# Patient Record
Sex: Female | Born: 1975 | Hispanic: Yes | Smoking: Never smoker
Health system: Southern US, Community
[De-identification: ages and names within clinical notes are randomized; demographics above are authoritative.]

## PROBLEM LIST (undated history)

## (undated) DIAGNOSIS — R519 Headache, unspecified: Secondary | ICD-10-CM

## (undated) DIAGNOSIS — R51 Headache: Secondary | ICD-10-CM

## (undated) DIAGNOSIS — M797 Fibromyalgia: Secondary | ICD-10-CM

## (undated) DIAGNOSIS — F419 Anxiety disorder, unspecified: Secondary | ICD-10-CM

## (undated) DIAGNOSIS — R002 Palpitations: Secondary | ICD-10-CM

## (undated) DIAGNOSIS — J45909 Unspecified asthma, uncomplicated: Secondary | ICD-10-CM

## (undated) DIAGNOSIS — S0300XA Dislocation of jaw, unspecified side, initial encounter: Secondary | ICD-10-CM

## (undated) HISTORY — DX: Headache, unspecified: R51.9

## (undated) HISTORY — DX: Headache: R51

## (undated) HISTORY — PX: NO PAST SURGERIES: SHX2092

## (undated) HISTORY — DX: Dislocation of jaw, unspecified side, initial encounter: S03.00XA

---

## 2011-05-23 ENCOUNTER — Other Ambulatory Visit (HOSPITAL_COMMUNITY)
Admission: RE | Admit: 2011-05-23 | Discharge: 2011-05-23 | Disposition: A | Discharge status: Home / Self Care | Payer: PRIVATE HEALTH INSURANCE | Source: Ambulatory Visit | Attending: Obstetrics and Gynecology | Admitting: Obstetrics and Gynecology

## 2011-05-23 DIAGNOSIS — Z124 Encounter for screening for malignant neoplasm of cervix: Secondary | ICD-10-CM | POA: Insufficient documentation

## 2011-05-23 DIAGNOSIS — Z113 Encounter for screening for infections with a predominantly sexual mode of transmission: Secondary | ICD-10-CM | POA: Insufficient documentation

## 2011-10-24 ENCOUNTER — Encounter (HOSPITAL_BASED_OUTPATIENT_CLINIC_OR_DEPARTMENT_OTHER): Payer: Self-pay | Admitting: Student

## 2011-10-24 ENCOUNTER — Emergency Department (HOSPITAL_BASED_OUTPATIENT_CLINIC_OR_DEPARTMENT_OTHER)
Admission: EM | Admit: 2011-10-24 | Discharge: 2011-10-24 | Disposition: A | Discharge status: Home / Self Care | Payer: PRIVATE HEALTH INSURANCE | Attending: Emergency Medicine | Admitting: Emergency Medicine

## 2011-10-24 DIAGNOSIS — R0789 Other chest pain: Secondary | ICD-10-CM | POA: Insufficient documentation

## 2011-10-24 DIAGNOSIS — R7309 Other abnormal glucose: Secondary | ICD-10-CM | POA: Insufficient documentation

## 2011-10-24 DIAGNOSIS — R300 Dysuria: Secondary | ICD-10-CM | POA: Insufficient documentation

## 2011-10-24 DIAGNOSIS — R739 Hyperglycemia, unspecified: Secondary | ICD-10-CM

## 2011-10-24 DIAGNOSIS — Z885 Allergy status to narcotic agent status: Secondary | ICD-10-CM | POA: Insufficient documentation

## 2011-10-24 DIAGNOSIS — K5900 Constipation, unspecified: Secondary | ICD-10-CM | POA: Insufficient documentation

## 2011-10-24 DIAGNOSIS — Z882 Allergy status to sulfonamides status: Secondary | ICD-10-CM | POA: Insufficient documentation

## 2011-10-24 DIAGNOSIS — F411 Generalized anxiety disorder: Secondary | ICD-10-CM | POA: Insufficient documentation

## 2011-10-24 HISTORY — DX: Anxiety disorder, unspecified: F41.9

## 2011-10-24 LAB — URINALYSIS, ROUTINE W REFLEX MICROSCOPIC
Nitrite: NEGATIVE
Specific Gravity, Urine: 1.006 (ref 1.005–1.030)
Urobilinogen, UA: 0.2 mg/dL (ref 0.0–1.0)
pH: 7 (ref 5.0–8.0)

## 2011-10-24 LAB — URINE MICROSCOPIC-ADD ON

## 2011-10-24 LAB — TROPONIN I: Troponin I: <0.3 ng/mL (ref <0.30)

## 2011-10-24 LAB — BASIC METABOLIC PANEL
Chloride: 102 mEq/L (ref 96–112)
GFR calc Af Amer: >90 mL/min (ref >90)
GFR calc non Af Amer: >90 mL/min (ref >90)
Potassium: 3.7 mEq/L (ref 3.5–5.1)
Sodium: 138 mEq/L (ref 135–145)

## 2011-10-24 LAB — D-DIMER, QUANTITATIVE: D-Dimer, Quant: 0.27 ug/mL-FEU (ref 0.00–0.48)

## 2011-10-24 MED ORDER — IBUPROFEN 200 MG PO TABS: 200.0000 mg | ORAL_TABLET | Freq: Four times a day (QID) | ORAL | Status: DC | PRN

## 2011-10-24 MED ORDER — DOCUSATE SODIUM 100 MG PO CAPS: 100.0000 mg | ORAL_CAPSULE | Freq: Two times a day (BID) | ORAL | Status: DC

## 2011-10-24 MED ORDER — ACETAMINOPHEN 325 MG PO TABS: 325.0000 mg | ORAL_TABLET | Freq: Four times a day (QID) | ORAL | Status: DC | PRN

## 2011-10-24 MED ORDER — POLYETHYLENE GLYCOL 3350 17 GM/SCOOP PO POWD: 17.0000 g | Freq: Every day | ORAL | Status: DC

## 2011-10-24 MED ORDER — GI COCKTAIL ~~LOC~~
30.0000 mL | Freq: Once | ORAL | Status: AC
  Administered 2011-10-24: 30 mL via ORAL
  Filled 2011-10-24: qty 30

## 2011-10-24 NOTE — ED Notes (Addendum)
Pt in with c/o intermittent left side upper chest pain. Denies radiation, N V D LOC SOB. EKG upon arrival to ED. Reports prior hx of anxiety attacks and take xanax PRN, however reports this doesn't feel like prior anxiety attacks. Reports onset of CP 2 days ago.

## 2011-10-24 NOTE — ED Provider Notes (Signed)
History     CSN: [id number]  Arrival date & time 10/24/11  5784   First MD Initiated Contact with Patient 10/24/11 [phone]      Chief Complaint  Patient presents with  . Chest Pain    (Consider location/radiation/quality/duration/timing/severity/associated sxs/prior treatment) HPI Christine Rosales is a 36 yo woman with PMH significant for heartburn, and anxiety with panic attacks who presents to day with chest pain that started last night. She described being in her usual state of health until last night when she started experiencing chest pain after blow drying her hair and eating some dark chocolate. The pain is localized to her upper left chest, it comes and goes, it is described as sharp, worse when she lies down, and improves when she walks or stands. She took Gaviscon and had relief of her pain for 1 hour but still could not sleep last night 2/2 pain. She never had chest pain like this before.   She has had panic attacks before but this does not feel like one. She takes Xanax once per week which she took 3 days ago.   She has had constipation for the last 3 days with 1 small BM of hard stools early this AM. She also complaints of intermittent dysuria.   Currently she does not take [facility] medications. She is unsure of her LMP as she had breakthrough bleeding this month, which is unusual for her as she has regular menstrual cycles.   There is no family history of CAD or heart disease that she can remember.  Past Medical History  Diagnosis Date  . Anxiety     No past surgical history on file.  No family history on file.  History  Substance Use Topics  . Smoking status: Never Smoker   . Smokeless tobacco: Not on file  . Alcohol Use: No    OB History    Grav Para Term Preterm Abortions TAB SAB Ect Mult Living                  Review of Systems  Constitutional: Negative for fever, chills, diaphoresis, activity change, appetite change and fatigue.  HENT: Negative for neck pain and neck  stiffness.   Respiratory: Negative for cough, chest tightness and shortness of breath.   Cardiovascular: Positive for chest pain. Negative for palpitations and leg swelling.  Gastrointestinal: Positive for abdominal pain and constipation. Negative for nausea, vomiting, diarrhea, blood in stool and abdominal distention.  Genitourinary: Positive for dysuria. Negative for frequency and pelvic pain.  Musculoskeletal: Negative for back pain.  Skin: Negative for rash.  Neurological: Negative for dizziness and headaches.  Psychiatric/Behavioral: Negative for behavioral problems.    Allergies  Codeine and Sulfa antibiotics  Home Medications  No current outpatient prescriptions on file.  BP 148/86  Pulse 108  Temp 98.2 F (36.8 C) (Oral)  Resp 16  Wt 118 lb (53.524 kg)  SpO2 100%  LMP 10/10/2011  Physical Exam  Constitutional: She is oriented to person, place, and time. She appears well-developed and well-nourished. No distress.  HENT:  Head: Atraumatic.  Eyes: Conjunctivae normal are normal. Right eye exhibits no discharge. Left eye exhibits no discharge. No scleral icterus.  Neck: No JVD present.  Cardiovascular: Regular rhythm, normal heart sounds and intact distal pulses.  Exam reveals no gallop and no friction rub.   No murmur heard.      tachycardic  Pulmonary/Chest: Effort normal and breath sounds normal. No respiratory distress. She has no wheezes. She exhibits  tenderness.       Left upper chest point tenderness at upper sternal border, not worse with movement.    Abdominal: Soft. She exhibits no distension and no mass. There is tenderness. There is no rebound and no guarding.       Hyperactive bowel sounds. Tenderness on deep palpation  Musculoskeletal: Normal range of motion. She exhibits no edema and no tenderness.  Neurological: She is alert and oriented to person, place, and time.  Skin: Skin is warm and dry. She is not diaphoretic. No erythema.  Psychiatric: She has a  normal mood and affect. Her behavior is normal.    ED Course  Procedures (including critical care time)  Labs Reviewed  BASIC METABOLIC PANEL - Abnormal; Notable for the following:    Glucose, Bld 109 (*)     All other components within normal limits  URINALYSIS, ROUTINE W REFLEX MICROSCOPIC - Abnormal; Notable for the following:    Hgb urine dipstick TRACE (*)     Leukocytes, UA TRACE (*)     All other components within normal limits  URINE MICROSCOPIC-ADD ON - Abnormal; Notable for the following:    Squamous Epithelial / LPF FEW (*)     Bacteria, UA FEW (*)     All other components within normal limits  D-DIMER, QUANTITATIVE  PREGNANCY, URINE  TROPONIN I  URINE CULTURE   No results found.   1. Localized chest pain   2. Constipation   3. Dysuria   4. Hyperglycemia       MDM  36 yo woman with PMH of anxiety, heartburn, presenting with new onset CP.   Chest pain. Pt has not hx of heart disease but is tachycardic. EKG with tachycardia, no LVH, no ST depression or elevation. To include PE v. ACS v. Costochondritis v. Anxiety v. Referred GI. PE less likely, with no hx of DVT, no recent trauma or surgery, and no estrogen tx. However, per [facility] score, pt tachycardic cannot exclude PE at this time, will check D-Dimer. ACS less likely given unremarkable EKG and not hx of heart disease or family hx of heart disease. Costochondritis possible given point tenderness and hx of blow-drying her hair as a precipitating event. Anxiety could be a possibility although the patient reports different symptoms with anxiety/panic attacks. Referred GI pain also likely given recent constipation.  -D-Dimer negative, PE less likely -Troponin I negative, ACS less likely -BMET normal except for mild hyperglicemia -GI cocktail given, the pain improved but no BM yet  Dysuria. No increased frequency but intermittent dysuria and suprapubic pain.  -UA with few bacteria, few squamous cells, trace leukocytes,  negative for nitrites -Pregnancy test negative -Ordered urine culture  Dispo. Pt to be discharged home, instructed to use Miralax once every day and Colace for stool softener.  Urine culture pending at the time of discharge. Pt has used Augmentin in the past with good results, no reactions.   Patient will follow up with new PCP, possibly Dr. Alwyn Ren for established care, anxiety management, and possible UTI and hyperglycemia work up.

## 2011-10-25 NOTE — ED Provider Notes (Signed)
Medical screening examination/treatment/procedure(s) were performed by non-physician practitioner and as supervising physician I was immediately available for consultation/collaboration.  Medical screening examination/treatment/procedure(s) were performed by non-physician practitioner and as supervising physician I was immediately available for consultation/collaboration.    Nelia Shi, MD 10/25/11 [phone]

## 2011-10-26 LAB — URINE CULTURE: Colony Count: >=100000

## 2011-10-28 NOTE — ED Notes (Signed)
+   Urine Chart sent to EDP office for review. 

## 2011-11-03 NOTE — ED Notes (Signed)
rx for Macrobid 100 mg SIG : 1 tab po BID x 7 take with food DISP #14 refill none per PA-C

## 2011-11-04 ENCOUNTER — Telehealth (HOSPITAL_COMMUNITY): Payer: Self-pay | Admitting: *Deleted

## 2011-11-07 NOTE — ED Notes (Signed)
Pt husband in lobby requesting to pick up prescription for pt. Pt advised to check with pharmacy due to last visit to this dept was on 09/23

## 2012-04-15 ENCOUNTER — Emergency Department (HOSPITAL_BASED_OUTPATIENT_CLINIC_OR_DEPARTMENT_OTHER)
Admission: EM | Admit: 2012-04-15 | Discharge: 2012-04-15 | Disposition: A | Discharge status: Home / Self Care | Payer: PRIVATE HEALTH INSURANCE | Attending: Emergency Medicine | Admitting: Emergency Medicine

## 2012-04-15 ENCOUNTER — Encounter (HOSPITAL_BASED_OUTPATIENT_CLINIC_OR_DEPARTMENT_OTHER): Payer: Self-pay

## 2012-04-15 DIAGNOSIS — R110 Nausea: Secondary | ICD-10-CM | POA: Insufficient documentation

## 2012-04-15 DIAGNOSIS — T43205A Adverse effect of unspecified antidepressants, initial encounter: Secondary | ICD-10-CM | POA: Insufficient documentation

## 2012-04-15 DIAGNOSIS — Z79899 Other long term (current) drug therapy: Secondary | ICD-10-CM | POA: Insufficient documentation

## 2012-04-15 DIAGNOSIS — IMO0001 Reserved for inherently not codable concepts without codable children: Secondary | ICD-10-CM | POA: Insufficient documentation

## 2012-04-15 DIAGNOSIS — I10 Essential (primary) hypertension: Secondary | ICD-10-CM | POA: Insufficient documentation

## 2012-04-15 DIAGNOSIS — R42 Dizziness and giddiness: Secondary | ICD-10-CM | POA: Insufficient documentation

## 2012-04-15 DIAGNOSIS — F411 Generalized anxiety disorder: Secondary | ICD-10-CM | POA: Insufficient documentation

## 2012-04-15 HISTORY — DX: Fibromyalgia: M79.7

## 2012-04-15 NOTE — ED Notes (Signed)
Pt states that she has started taking medication for depression, blood pressure, and antibiotics for UTI.  Pt states that she feels light headed, dizziness, and seems anxious at triage.

## 2012-04-15 NOTE — ED Provider Notes (Signed)
History     CSN: [id number]  Arrival date & time 04/15/12  1246   First MD Initiated Contact with Patient 04/15/12 1258      Chief Complaint  Patient presents with  . Dizziness  . Hypertension    (Consider location/radiation/quality/duration/timing/severity/associated sxs/prior treatment) Patient is a 37 y.o. female presenting with hypertension.  Hypertension   Pt with history of anxiety and fibromyalgia was recently started on Cipro for UTI. She was also given Cymbalta for her fibromylagia and anxiety and atenolol for palpitations. She states she took Cymbalta for first time this AM and a short time later began to feel anxious, nauseated, hot flashes and lightheaded. She took her BP and it was elevated (140/100) so she took an atenolol a few hours later.   Past Medical History  Diagnosis Date  . Anxiety   . Fibromyalgia     History reviewed. No pertinent past surgical history.  History reviewed. No pertinent family history.  History  Substance Use Topics  . Smoking status: Never Smoker   . Smokeless tobacco: Not on file  . Alcohol Use: No    OB History   Grav Para Term Preterm Abortions TAB SAB Ect Mult Living                  Review of Systems All other systems reviewed and are negative except as noted in HPI.   Allergies  Codeine and Sulfa antibiotics  Home Medications   Current Outpatient Rx  Name  Route  Sig  Dispense  Refill  . atenolol (TENORMIN) 25 MG tablet   Oral   Take 25 mg by mouth daily.         . ciprofloxacin (CIPRO) 500 MG tablet   Oral   Take 500 mg by mouth 2 (two) times daily.         . DULoxetine (CYMBALTA) 30 MG capsule   Oral   Take 30 mg by mouth daily.         Marland Kitchen acetaminophen (TYLENOL) 325 MG tablet   Oral   Take 1 tablet (325 mg total) by mouth every 6 (six) hours as needed for pain.   60 tablet   0   . docusate sodium (COLACE) 100 MG capsule   Oral   Take 1 capsule (100 mg total) by mouth 2 (two) times  daily.   10 capsule   0   . ibuprofen (ADVIL) 200 MG tablet   Oral   Take 1 tablet (200 mg total) by mouth every 6 (six) hours as needed for pain.   30 tablet   0   . polyethylene glycol powder (MIRALAX) powder   Oral   Take 17 g by mouth daily.   255 g   0     BP 127/90  Pulse 79  Temp(Src) 98.1 F (36.7 C) (Oral)  Resp 24  Ht 5\' 4"  (1.626 m)  Wt 118 lb (53.524 kg)  BMI 20.24 kg/m2  SpO2 100%  LMP 04/15/2012  Physical Exam  Nursing note and vitals reviewed. Constitutional: She is oriented to person, place, and time. She appears well-developed and well-nourished.  HENT:  Head: Normocephalic and atraumatic.  Eyes: EOM are normal. Pupils are equal, round, and reactive to light.  Neck: Normal range of motion. Neck supple.  Cardiovascular: Normal rate, normal heart sounds and intact distal pulses.   Pulmonary/Chest: Effort normal and breath sounds normal.  Abdominal: Bowel sounds are normal. She exhibits no distension. There is no tenderness.  Musculoskeletal: Normal range of motion. She exhibits no edema and no tenderness.  Neurological: She is alert and oriented to person, place, and time. She has normal strength. No cranial nerve deficit or sensory deficit.  Skin: Skin is warm and dry. No rash noted.  Psychiatric: She has a normal mood and affect.    ED Course  Procedures (including critical care time)  Labs Reviewed - No data to display No results found.   No diagnosis found.    MDM  Vitals here unremarkable. She is likely having adverse reaction to Cymbalta. Pt and family at bedside state she has had similar reactions to other medications in the past. Advised to continue atenolol, stop Cymbalta and follow up with PCP. No allergic reaction. No indication for ED lab or imaging studies.         Charles B. Bernette Mayers, MD 04/15/12 1316

## 2013-10-25 ENCOUNTER — Emergency Department (HOSPITAL_BASED_OUTPATIENT_CLINIC_OR_DEPARTMENT_OTHER)
Admission: EM | Admit: 2013-10-25 | Discharge: 2013-10-25 | Disposition: A | Discharge status: Home / Self Care | Payer: PRIVATE HEALTH INSURANCE | Attending: Emergency Medicine | Admitting: Emergency Medicine

## 2013-10-25 ENCOUNTER — Encounter (HOSPITAL_BASED_OUTPATIENT_CLINIC_OR_DEPARTMENT_OTHER): Payer: Self-pay | Admitting: Emergency Medicine

## 2013-10-25 DIAGNOSIS — IMO0002 Reserved for concepts with insufficient information to code with codable children: Secondary | ICD-10-CM | POA: Insufficient documentation

## 2013-10-25 DIAGNOSIS — R6884 Jaw pain: Secondary | ICD-10-CM | POA: Diagnosis present

## 2013-10-25 DIAGNOSIS — Z8659 Personal history of other mental and behavioral disorders: Secondary | ICD-10-CM | POA: Diagnosis not present

## 2013-10-25 DIAGNOSIS — M2669 Other specified disorders of temporomandibular joint: Secondary | ICD-10-CM | POA: Insufficient documentation

## 2013-10-25 DIAGNOSIS — Z79899 Other long term (current) drug therapy: Secondary | ICD-10-CM | POA: Insufficient documentation

## 2013-10-25 HISTORY — DX: Palpitations: R00.2

## 2013-10-25 MED ORDER — PREDNISONE 10 MG PO TABS: ORAL_TABLET | ORAL | Status: DC

## 2013-10-25 MED ORDER — HYDROCODONE-ACETAMINOPHEN 5-325 MG PO TABS: 1.0000 | ORAL_TABLET | ORAL | Status: DC | PRN

## 2013-10-25 NOTE — ED Notes (Signed)
NP at bedside.

## 2013-10-25 NOTE — ED Notes (Signed)
Pt reports taking ibuprofen. Also sts taking mobic with minimal relief.

## 2013-10-25 NOTE — Discharge Instructions (Signed)
Dislocacin o subluxacin (Dislocation or Subluxation) La dislocacin de una articulacin se produce cuando los extremos de dos o ms huesos adyacentes dejan de Careers adviser. Una subluxacin es una forma menor de dislocacin, en la que dos o ms huesos adyacentes no estn correctamente alineados. Las articulaciones ms susceptibles de dislocacin son los hombros, las rtulas y los dedos.  SNTOMAS  Dolor instantneo en el momento de la lesin.  Deformidad notoria en la zona de la articulacin.  Amplitud de movimientos limitada. CAUSAS  Generalmente es un traumatismo que distiende o rompe los ligamentos que rodean la articulacin y que [employer] huesos juntos.  En algunos casos es un defecto que est presente al nacer (congnito) debido a que las superficies de las articulaciones no son profundas o Location manager.  Enfermedad de las articulaciones, como en el caso de la artritis u otras enfermedades de los ligamentos y los tejidos alrededor de Water engineer. LOS RIESGOS AUMENTAN CON:  Traumatismos repetidos en la articulacin.  Dislocacin previa de una articulacin.  Deportes de Diplomatic Services operational officer (ftbol, rugby, hockey o lacrosse) o deportes que requieran movimientos repetitivos del brazo por encima de la cabeza (lanzamiento, natacin, vley)  Artritis reumatoidea  Afecciones congnitas de las articulaciones. PREVENCIN  Architectural technologist y elongue adecuadamente antes de [place].  Mantenga un estado fsico adecuado.  La flexibilidad de las articulaciones  La fuerza y la resistencia muscular.  El buen [place] cardiovascular.  Utilizar un equipo protector y asegurarse de Firefighter.  Aprenda y [employer]. PRONSTICO Este trastorno generalmente es curable con el tratamiento adecuado. Luego que las articulaciones que sufrieron la dislocacin se Naval architect (reducido), podr necesitar cierto tiempo de inmovilizacin  con un yeso, tabilla o cabestrillo durante 2 a 6 semanas, a las que seguir un perodo de ejercicios de elongacin y fortalecimiento que podr Optometrist en su casa o con un terapeuta. COMPLICACIONES RELACIONADAS  Lesin en los nervios o en los principales vasos sanguneos de la zona, lo que causa adormecimiento, enfriamiento o palidez.  Lesiones recurrentes en la articulacin.  Artritis en la articulacin afectada.  Fractura de la articulacin TRATAMIENTO El tratamiento inicialmente implica la alineacin de los huesos (reduccin) de Water engineer. La reduccin slo puede realizarla una persona entrenada en el procedimiento. Luego de la reduccin de Water engineer, le indicarn medicamentos y la aplicacin de hielo para Best boy y la inflamacin. Se inmovilizar la articulacin para permitir que los msculos y ligamentos se curen. Si una articulacin sufre dislocaciones recurrentes, ser necesario que se someta a una ciruga para tensar o Training and development officer las Editor, commissioning. Luego de la ciruga, deber Optometrist ejercicios de elongacin y fortalecimiento. Los ejercicios pueden Press photographer o con un terapeuta. MEDICAMENTOS  Algunos pacientes necesitarn sedantes o relajantes musculares para realizar la reduccin de la articulacin  Si necesita analgsicos, se recomiendan los antiinflamatorios no esteroides, como aspirina e ibuprofeno y otros calmantes menores, como acetaminofeno  No los tome dentro de los 7 das previos a la [place].  Podrn ser necesarios los analgsicos prescriptos. Utilcelos como se le indique y slo cuando lo necesite. SOLICITE ATENCIN MDICA SI:   Los sntomas empeoran o no mejoran a Designer, television/film set.  Tiene dificultad para mover una articulacin luego del traumatismo.  [place] extremidades se adormece se torna plida o fra luego de un traumatismo. Esto es Engineer, maintenance (IT).  Las dislocaciones o subluxaciones pueden ocurrir  repetidamente. Document Released: 11/03/2005 Document Revised: 04/11/2011 ExitCare Patient  Information 2015 [place], [place]. This information is not intended to replace advice given to you by your health care provider. Make sure you discuss any questions you have with your health care provider.

## 2013-10-25 NOTE — ED Provider Notes (Signed)
CSN: [id number]     Arrival date & time 10/25/13  1344 History   First MD Initiated Contact with Patient 10/25/13 1424     Chief Complaint  Patient presents with  . Jaw Pain     (Consider location/radiation/quality/duration/timing/severity/associated sxs/prior Treatment) HPI Comments: Pt states that she had chronic left jaw pain and she was told at one point that she has tmj. She states that yesterday she bit into something and now she is having pain with opening her mouth. She states that she has been taking ibuprofen without much relief. Denies swelling to the area  The history is provided by the patient. No language interpreter was used.    Past Medical History  Diagnosis Date  . Anxiety   . Fibromyalgia   . Palpitations    History reviewed. No pertinent past surgical history. No family history on file. History  Substance Use Topics  . Smoking status: Never Smoker   . Smokeless tobacco: Not on file  . Alcohol Use: No   OB History   Grav Para Term Preterm Abortions TAB SAB Ect Mult Living                 Review of Systems  Constitutional: Negative.   Respiratory: Negative.   Cardiovascular: Negative.       Allergies  Codeine and Sulfa antibiotics  Home Medications   Prior to Admission medications   Medication Sig Start Date End Date Taking? Authorizing Provider  atenolol (TENORMIN) 25 MG tablet Take 25 mg by mouth daily.   Yes Historical Provider, MD  ciprofloxacin (CIPRO) 500 MG tablet Take 500 mg by mouth 2 (two) times daily.    Historical Provider, MD  HYDROcodone-acetaminophen (NORCO/VICODIN) 5-325 MG per tablet Take 1-2 tablets by mouth every 4 (four) hours as needed. 10/25/13   Glendell Docker, NP  predniSONE (DELTASONE) 10 MG tablet 6 day step down dose 10/25/13   Glendell Docker, NP   BP 122/93  Pulse 81  Temp(Src) 98.2 F (36.8 C) (Oral)  Resp 18  Ht 5' 3.5" (1.613 m)  Wt 120 lb (54.432 kg)  BMI 20.92 kg/m2  SpO2 100%  LMP 10/25/2013 Physical  Exam  Nursing note and vitals reviewed. Constitutional: She appears well-developed and well-nourished.  HENT:  Head: Normocephalic and atraumatic.  Right Ear: External ear normal.  Left Ear: External ear normal.  tmj click bilaterally. Opening mouth without any problem. Pain with palpation  Eyes: EOM are normal.  Cardiovascular: Normal rate and regular rhythm.   Pulmonary/Chest: Effort normal and breath sounds normal.  Musculoskeletal: Normal range of motion.    ED Course  Procedures (including critical care time) Labs Review Labs Reviewed - No data to display  Imaging Review No results found.   EKG Interpretation None      MDM   Final diagnoses:  TMJ inflammation    No sign of dislocation or subluxation. Will treat with prednisone and pain medication and gave referral to dentist for continued symptoms    Glendell Docker, NP 10/25/13 1452

## 2013-10-25 NOTE — ED Provider Notes (Signed)
Medical screening examination/treatment/procedure(s) were performed by non-physician practitioner and as supervising physician I was immediately available for consultation/collaboration.   Houston Siren III, MD 10/25/13 [phone]

## 2013-10-25 NOTE — ED Notes (Addendum)
Patient states she was eating yesterday and felt a pop in her left jaw.  Now has limited range of motion in her jaw with pain, with pain radiating into left neck, shoulder and arm.  History of TMJ.

## 2014-06-25 ENCOUNTER — Emergency Department (HOSPITAL_BASED_OUTPATIENT_CLINIC_OR_DEPARTMENT_OTHER)
Admission: EM | Admit: 2014-06-25 | Discharge: 2014-06-25 | Disposition: A | Discharge status: Home / Self Care | Payer: PRIVATE HEALTH INSURANCE | Attending: Emergency Medicine | Admitting: Emergency Medicine

## 2014-06-25 ENCOUNTER — Encounter (HOSPITAL_BASED_OUTPATIENT_CLINIC_OR_DEPARTMENT_OTHER): Payer: Self-pay | Admitting: *Deleted

## 2014-06-25 ENCOUNTER — Emergency Department (HOSPITAL_BASED_OUTPATIENT_CLINIC_OR_DEPARTMENT_OTHER): Payer: PRIVATE HEALTH INSURANCE

## 2014-06-25 DIAGNOSIS — S199XXA Unspecified injury of neck, initial encounter: Secondary | ICD-10-CM | POA: Diagnosis present

## 2014-06-25 DIAGNOSIS — Y9301 Activity, walking, marching and hiking: Secondary | ICD-10-CM | POA: Insufficient documentation

## 2014-06-25 DIAGNOSIS — M797 Fibromyalgia: Secondary | ICD-10-CM | POA: Diagnosis not present

## 2014-06-25 DIAGNOSIS — S239XXA Sprain of unspecified parts of thorax, initial encounter: Secondary | ICD-10-CM | POA: Diagnosis not present

## 2014-06-25 DIAGNOSIS — Y9289 Other specified places as the place of occurrence of the external cause: Secondary | ICD-10-CM | POA: Insufficient documentation

## 2014-06-25 DIAGNOSIS — Y998 Other external cause status: Secondary | ICD-10-CM | POA: Insufficient documentation

## 2014-06-25 DIAGNOSIS — S161XXA Strain of muscle, fascia and tendon at neck level, initial encounter: Secondary | ICD-10-CM | POA: Insufficient documentation

## 2014-06-25 DIAGNOSIS — Z79899 Other long term (current) drug therapy: Secondary | ICD-10-CM | POA: Diagnosis not present

## 2014-06-25 DIAGNOSIS — F419 Anxiety disorder, unspecified: Secondary | ICD-10-CM | POA: Insufficient documentation

## 2014-06-25 DIAGNOSIS — R52 Pain, unspecified: Secondary | ICD-10-CM

## 2014-06-25 DIAGNOSIS — W108XXA Fall (on) (from) other stairs and steps, initial encounter: Secondary | ICD-10-CM | POA: Insufficient documentation

## 2014-06-25 DIAGNOSIS — S29012A Strain of muscle and tendon of back wall of thorax, initial encounter: Secondary | ICD-10-CM | POA: Insufficient documentation

## 2014-06-25 DIAGNOSIS — S233XXA Sprain of ligaments of thoracic spine, initial encounter: Secondary | ICD-10-CM

## 2014-06-25 MED ORDER — KETOROLAC TROMETHAMINE 30 MG/ML IJ SOLN
30.0000 mg | Freq: Once | INTRAMUSCULAR | Status: AC
  Administered 2014-06-25: 30 mg via INTRAMUSCULAR
  Filled 2014-06-25: qty 1

## 2014-06-25 NOTE — ED Notes (Addendum)
Pt sts her PCP put her on Lyrica 2 weeks ago for fibromyalgia. She sts the med makes her dizzy and last night when walking down stairs, she slipped and fell landing on her buttocks. She sts that since the fall, she has had increased pain and now has upper, middle back pain. Pt also c/o swelling in her face and tachycardia.

## 2014-06-25 NOTE — ED Provider Notes (Signed)
CSN: [id number]     Arrival date & time 06/25/14  1559 History   First MD Initiated Contact with Patient 06/25/14 1610     Chief Complaint  Patient presents with  . Fall    Patient is a 39 y.o. female presenting with fall. The history is provided by the patient.  Fall This is a new problem. The current episode started yesterday. Pertinent negatives include no chest pain, no abdominal pain and no headaches. The symptoms are aggravated by walking. The symptoms are relieved by rest.  pt reports she fell on step yesterday landing mostly on her buttocks but she may have hit her neck and back in fall.  No head injury.  No LOC.  No new HA  She reports recently starting lyrica for fibromyalgia and this has made her dizzy recently. No LOC reported  Past Medical History  Diagnosis Date  . Anxiety   . Fibromyalgia   . Palpitations    History reviewed. No pertinent past surgical history. No family history on file. History  Substance Use Topics  . Smoking status: Never Smoker   . Smokeless tobacco: Not on file  . Alcohol Use: No   OB History    No data available     Review of Systems  Constitutional: Negative for fever.  Cardiovascular: Negative for chest pain.  Gastrointestinal: Negative for abdominal pain.  Musculoskeletal: Positive for back pain and neck pain.  Neurological: Negative for headaches.  All other systems reviewed and are negative.     Allergies  Codeine; Sulfa antibiotics; and Sudafed  Home Medications   Prior to Admission medications   Medication Sig Start Date End Date Taking? Authorizing Provider  ALPRAZolam Duanne Moron) 0.25 MG tablet Take 0.25 mg by mouth at bedtime as needed for anxiety.   Yes Historical Provider, MD  atenolol (TENORMIN) 25 MG tablet Take 25 mg by mouth daily.   Yes Historical Provider, MD  pregabalin (LYRICA) 75 MG capsule Take 75 mg by mouth 3 (three) times daily.   Yes Historical Provider, MD   BP 127/90 mmHg  Pulse 81  Temp(Src) 98.4  F (36.9 C) (Oral)  Resp 16  Ht 5\' 3"  (1.6 m)  Wt 124 lb (56.246 kg)  BMI 21.97 kg/m2  SpO2 100%  LMP 06/01/2014 Physical Exam CONSTITUTIONAL: Well developed/well nourished HEAD: Normocephalic/atraumatic EYES: EOMI/PERRL ENMT: Mucous membranes moist, no signs of trauma, no facial edema noted NECK: supple no meningeal signs SPINE/BACK:mild cervical/thoracic tenderness, no lumbar tenderness, No bruising/crepitance/stepoffs noted to spine CV: S1/S2 noted, no murmurs/rubs/gallops noted LUNGS: Lungs are clear to auscultation bilaterally, no apparent distress ABDOMEN: soft, nontender, no rebound or guarding, bowel sounds noted throughout abdomen GU:no cva tenderness NEURO: Pt is awake/alert/appropriate, moves all extremitiesx4.  No facial droop.  Gait without ataxia.  No arm/leg drift.  No past pointing EXTREMITIES: pulses normal/equal, full ROM, no obvious deformities noted SKIN: warm, color normal PSYCH: no abnormalities of mood noted, alert and oriented to situation  ED Course  Procedures   Medications  ketorolac (TORADOL) 30 MG/ML injection 30 mg (30 mg Intramuscular Given 06/25/14 1635)    Imaging Review Dg Cervical Spine Complete  06/25/2014   CLINICAL DATA:  Pain following fall down stairs  EXAM: CERVICAL SPINE  4+ VIEWS  COMPARISON:  None.  FINDINGS: Frontal, lateral, open-mouth odontoid, and bilateral oblique views were obtained. There is no fracture or spondylolisthesis. Prevertebral soft tissues and predental space regions are normal. Disc spaces appear intact. There is no appreciable facet arthropathy. There  is reversal of lordotic curvature.  IMPRESSION: There is reversal of lordotic curvature. This finding most likely is indicative of muscle spasm. If, however, there is concern clinically for ligamentous injury, lateral flexion-extension views could be helpful to further evaluate. No fracture or spondylolisthesis. No appreciable arthropathy.   Electronically Signed   By:  Lowella Grip III M.D.   On: 06/25/2014 17:11   Dg Thoracic Spine 4v  06/25/2014   CLINICAL DATA:  Pain following fall down stairs  EXAM: THORACIC SPINE - 3 VIEW  COMPARISON:  None.  FINDINGS: Frontal, lateral, and swimmer's views were obtained. There is no fracture or spondylolisthesis. Disc spaces appear intact. No erosive change.  IMPRESSION: No fracture or spondylolisthesis.  No appreciable arthropathy.   Electronically Signed   By: Lowella Grip III M.D.   On: 06/25/2014 17:13    Pt well appearing, no distress, ambulatory, doubt occult cspine/thoracic injury (no fx on XRAY) Will need f/u with PCP given med reaction to lyrica, she wants to stop this medication   MDM   Final diagnoses:  Pain  Cervical strain, acute, initial encounter  Thoracic sprain and strain, initial encounter    Nursing notes including past medical history and social history reviewed and considered in documentation ,xrays/imaging reviewed by myself and considered during evaluation     Ripley Fraise, MD 06/25/14 1745

## 2014-06-25 NOTE — Discharge Instructions (Signed)
You have neck pain, possibly from a cervical strain and/or pinched nerve.   SEEK IMMEDIATE MEDICAL ATTENTION IF: You develop difficulties swallowing or breathing.  You have new or worse numbness, weakness, tingling, or movement problems in your arms or legs.  You develop increasing pain which is uncontrolled with medications.  You have change in bowel or bladder function, or other concerns.  SEEK IMMEDIATE MEDICAL ATTENTION IF: New numbness, tingling, weakness, or problem with the use of your arms or legs.  Severe back pain not relieved with medications.  Change in bowel or bladder control (if you lose control of stool or urine, or if you are unable to urinate) Increasing pain in any areas of the body (such as chest or abdominal pain).  Shortness of breath, dizziness or fainting.  Nausea (feeling sick to your stomach), vomiting, fever, or sweats.

## 2014-10-21 ENCOUNTER — Emergency Department (HOSPITAL_BASED_OUTPATIENT_CLINIC_OR_DEPARTMENT_OTHER)
Admission: EM | Admit: 2014-10-21 | Discharge: 2014-10-21 | Disposition: A | Discharge status: Home / Self Care | Payer: PRIVATE HEALTH INSURANCE | Attending: Emergency Medicine | Admitting: Emergency Medicine

## 2014-10-21 ENCOUNTER — Encounter (HOSPITAL_BASED_OUTPATIENT_CLINIC_OR_DEPARTMENT_OTHER): Payer: Self-pay | Admitting: *Deleted

## 2014-10-21 DIAGNOSIS — M542 Cervicalgia: Secondary | ICD-10-CM | POA: Diagnosis not present

## 2014-10-21 DIAGNOSIS — M549 Dorsalgia, unspecified: Secondary | ICD-10-CM | POA: Diagnosis not present

## 2014-10-21 DIAGNOSIS — R112 Nausea with vomiting, unspecified: Secondary | ICD-10-CM | POA: Insufficient documentation

## 2014-10-21 DIAGNOSIS — Z79899 Other long term (current) drug therapy: Secondary | ICD-10-CM | POA: Insufficient documentation

## 2014-10-21 DIAGNOSIS — F419 Anxiety disorder, unspecified: Secondary | ICD-10-CM | POA: Diagnosis not present

## 2014-10-21 DIAGNOSIS — R21 Rash and other nonspecific skin eruption: Secondary | ICD-10-CM | POA: Diagnosis present

## 2014-10-21 DIAGNOSIS — R000 Tachycardia, unspecified: Secondary | ICD-10-CM | POA: Diagnosis not present

## 2014-10-21 DIAGNOSIS — B019 Varicella without complication: Secondary | ICD-10-CM | POA: Diagnosis not present

## 2014-10-21 MED ORDER — VALACYCLOVIR HCL 1 G PO TABS: 1000.0000 mg | ORAL_TABLET | Freq: Three times a day (TID) | ORAL | Status: DC

## 2014-10-21 MED ORDER — ONDANSETRON 8 MG PO TBDP
8.0000 mg | ORAL_TABLET | Freq: Three times a day (TID) | ORAL | Status: DC | PRN
Start: 2014-10-21 — End: 2015-02-27

## 2014-10-21 NOTE — ED Notes (Signed)
Pt placed on droplet precautions

## 2014-10-21 NOTE — Discharge Instructions (Signed)
Varicela (Chickenpox) La varicela es una enfermedad causada por un virus. La varicela puede ser muy grave en los adultos, quienes tienen mayor riesgo de [employer] nios. [place], se incluyen:  Neumona.  Infeccin en la piel.  Infeccin en los huesos (osteomielitis).  Infeccin en las articulaciones (artritis sptica).  Infeccin en el cerebro (encefalitis).  Sndrome de shock txico.  Problemas de sangrado.  Problemas de equilibrio y control muscular (ataxia cerebelosa).  Nacimiento de un beb con defectos congnitos si est embarazada.  Muerte. Si usted ya ha tenido varicela antes, es probable que no la tenga de [place]. Si est expuesto a la varicela y nunca ha tenido la enfermedad ni se ha colocado la vacuna, puede enfermarse en el transcurso de 21das. CAUSAS  La varicela es causada por un virus. El virus se propaga fcilmente de Ardelia Mems persona a la otra a travs de las diminutas gotas que quedan en el aire cuando una persona infectada tose o estornuda. Tambin se propaga a travs del lquido que produce la erupcin cutnea por varicela. FACTORES DE RIESGO Estn en riesgo las personas que nunca han tenido varicela y que nunca se han vacunado. Usted tambin puede correr ms riesgo de varicela si:  Es trabajador de [employer]	[place].  Es estudiante universitario.  Trabaja en las [place].  Vive en una institucin.  Es Health and safety inspector.  Tiene poca resistencia a las infecciones. Barada En esta erupcin, aparecen ampollas que pican y al cicatrizar forman Serita Grammes. La erupcin cutnea [place] despus de otros sntomas.  Dolores y [employer].  Dolor de [place].  Irritabilidad.  [place]ans[place]io.  Cristy Hilts.  Dolor de Investment banker, operational. DIAGNSTICO  El mdico har el diagnstico en funcin de sus sntomas. Pueden hacerle un anlisis de sangre para confirmar el diagnstico. TRATAMIENTO  Los  tratamientos pueden incluir:  Tomar un medicamento para acortar la enfermedad y reducir su gravedad.  Aplicar una locin de calamina para Barrister's clerk.  Usar bicarbonato de sodio o baos de avena para calmar la picazn en la piel. Pregntele al mdico si puede usar un medicamento de venta libre llamado antihistamnico para reducir Cabin crew. [place] los medicamentos solamente como se lo haya indicado el mdico.  Intente tomar un bao de inmersin en agua templada con frecuencia. Agregue varias cucharadas de bicarbonato de sodio o avena en el agua para que el bao tenga un efecto ms calmante. No se bae con agua caliente.  Aplique una bolsa de hielo o un pao fro [employer] erupcin cutnea para Barrister's clerk.  Lvese las manos con frecuencia. Esto reduce la probabilidad de contraer una infeccin bacteriana en la piel y de transmitir el virus a Producer, television/film/video.  No coma ni beba nada condimentado, salado o cido si tiene ampollas en la boca. Las bebidas y los alimentos blandos, suaves y fros sern ms fciles de Chartered loss adjuster.  No est cerca de:  Personas que no han tenido varicela y que no se han vacunado contra esta enfermedad.  Personas que tengan el sistema inmunitario dbil, por ejemplo, quienes tienen VIH, sida o cncer; quienes han tenido un trasplante o reciben quimioterapia; y quienes toman medicamentos que debilitan el sistema inmunitario.  Embarazadas.  Si usted tiene varicela y est en contacto con una persona que no tenido antes esta enfermedad o no se ha vacunado contra ella, que tiene el sistema inmunitario dbil  o que est embarazada, esa persona debe llamar a un mdico. SOLICITE ATENCIN MDICA DE INMEDIATO SI:   Tiene dificultad para respirar.  Sufre un dolor intenso de [place].  Presenta rigidez en el cuello.  Presenta dolor intenso o rigidez en las articulaciones.  Se siente desorientado o confundido.  Tiene  dificultad para caminar o mantener el equilibrio.  Tiene fiebre y los sntomas empeoran repentinamente.  El rea alrededor de una ampolla supura pus o est muy roja, caliente al tacto o dolorida. ASEGRESE DE QUE:  Comprende estas instrucciones.  Controlar su afeccin.  Recibir ayuda de inmediato si no mejora o si empeora. Document Released: 04/15/2008 Document Revised: 06/03/2013 [facility] Patient Information 2015 [place]. This information is not intended to replace advice given to you by your health care provider. Make sure you discuss any questions you have with your health care provider.

## 2014-10-21 NOTE — ED Provider Notes (Signed)
CSN: [id number]     Arrival date & time 10/21/14  0825 History   First MD Initiated Contact with Patient 10/21/14 [phone]     No chief complaint on file.    (Consider location/radiation/quality/duration/timing/severity/associated sxs/prior Treatment) The history is provided by the patient.   patient presents with fevers rash headaches and some nausea vomiting. Began Lexapro on Wednesday and states on Friday she began to feel bad. Had some nausea and vomiting initially. Then developed a rash. States it is blistering of the rash. Began on her thigh and is now diffusely over her body. Has headache and low back pain. No confusion. Reported fevers up to 100.5 at home. Patient states her mother has recently been diagnosed with chickenpox. Patient has not had immunizations because she is from [place]. No previous infection with chickenpox. No confusion.  Past Medical History  Diagnosis Date  . Anxiety   . Fibromyalgia   . Palpitations    History reviewed. No pertinent past surgical history. No family history on file. Social History  Substance Use Topics  . Smoking status: Never Smoker   . Smokeless tobacco: None  . Alcohol Use: No   OB History    No data available     Review of Systems  Constitutional: Positive for fever. Negative for appetite change.  Respiratory: Negative for cough.   Cardiovascular: Negative for chest pain.  Gastrointestinal: Positive for nausea and vomiting.  Musculoskeletal: Positive for back pain and neck pain.  Skin: Positive for rash.  Neurological: Positive for headaches.      Allergies  Codeine; Sulfa antibiotics; and Sudafed  Home Medications   Prior to Admission medications   Medication Sig Start Date End Date Taking? Authorizing Provider  ALPRAZolam Duanne Moron) 0.25 MG tablet Take 0.25 mg by mouth at bedtime as needed for anxiety.   Yes Historical Provider, MD  atenolol (TENORMIN) 25 MG tablet Take 25 mg by mouth daily.   Yes Historical Provider,  MD  escitalopram (LEXAPRO) 20 MG tablet Take 10 mg by mouth daily.   Yes Historical Provider, MD  ondansetron (ZOFRAN-ODT) 8 MG disintegrating tablet Take 1 tablet (8 mg total) by mouth every 8 (eight) hours as needed for nausea or vomiting. 10/21/14   Davonna Belling, MD  valACYclovir (VALTREX) 1000 MG tablet Take 1 tablet (1,000 mg total) by mouth 3 (three) times daily. 10/21/14   Davonna Belling, MD   BP 120/67 mmHg  Pulse 101  Temp(Src) 98.2 F (36.8 C) (Oral)  Resp 18  Ht 5\' 4"  (1.626 m)  Wt 125 lb (56.7 kg)  BMI 21.45 kg/m2  SpO2 100%  LMP 10/06/2014 Physical Exam  Constitutional: She appears well-developed.  HENT:  Head: Normocephalic.  Eyes: Pupils are equal, round, and reactive to light.  Neck:  No meningismus.  Cardiovascular:  Mild tachycardia  Pulmonary/Chest: Effort normal.  Abdominal: Soft.  Musculoskeletal: Normal range of motion.  Neurological: She is alert.  Skin: Rash noted.  Vesicular rash somewhat diffusely. There is erythema with a central vesicle.      ED Course  Procedures (including critical care time) Labs Review Labs Reviewed - No data to display  Imaging Review No results found. I have personally reviewed and evaluated these images and lab results as part of my medical decision-making.   EKG Interpretation None      MDM   Final diagnoses:  Varicella    Patient with apparent parasellar. Has exposure in her mother. Has not been vaccinated. Discussed with Dr. De Burrs from infectious  disease. Doubt encephalitis this time. Doubt severe disease.. Patient is well appearing. Will start on valacyclovir. Patient does not work with small children or immunosuppressed people. She does not have contact with these. Will discharge home.    Davonna Belling, MD 10/21/14 [phone]

## 2014-10-21 NOTE — ED Notes (Addendum)
Started on lexapro on Wednesday and nausea and cold sweats on Friday with fever and h/a. Now has rash on back and abd. Rash is blistered areas and scabbed areas all over body. Tender area behind bil ears.

## 2015-02-26 ENCOUNTER — Telehealth: Payer: Self-pay | Admitting: *Deleted

## 2015-02-26 NOTE — Telephone Encounter (Signed)
Unable to complete pre-visit call as patient is primarily Spanish speaking per review.

## 2015-02-27 ENCOUNTER — Ambulatory Visit (HOSPITAL_BASED_OUTPATIENT_CLINIC_OR_DEPARTMENT_OTHER)
Admission: RE | Admit: 2015-02-27 | Discharge: 2015-02-27 | Disposition: A | Discharge status: Home / Self Care | Payer: No Typology Code available for payment source | Source: Ambulatory Visit | Attending: Medical | Admitting: Medical

## 2015-02-27 ENCOUNTER — Encounter: Payer: Self-pay | Admitting: Medical

## 2015-02-27 ENCOUNTER — Ambulatory Visit (INDEPENDENT_AMBULATORY_CARE_PROVIDER_SITE_OTHER): Payer: No Typology Code available for payment source | Admitting: Medical

## 2015-02-27 VITALS — BP 120/72 | HR 93 | Temp 98.1°F | Ht 64.0 in | Wt 120.2 lb

## 2015-02-27 DIAGNOSIS — R51 Headache: Secondary | ICD-10-CM | POA: Diagnosis not present

## 2015-02-27 DIAGNOSIS — R519 Headache, unspecified: Secondary | ICD-10-CM

## 2015-02-27 DIAGNOSIS — S46819A Strain of other muscles, fascia and tendons at shoulder and upper arm level, unspecified arm, initial encounter: Secondary | ICD-10-CM

## 2015-02-27 DIAGNOSIS — F411 Generalized anxiety disorder: Secondary | ICD-10-CM

## 2015-02-27 DIAGNOSIS — R6884 Jaw pain: Secondary | ICD-10-CM | POA: Diagnosis not present

## 2015-02-27 DIAGNOSIS — M2669 Other specified disorders of temporomandibular joint: Secondary | ICD-10-CM

## 2015-02-27 DIAGNOSIS — S0300XA Dislocation of jaw, unspecified side, initial encounter: Secondary | ICD-10-CM

## 2015-02-27 DIAGNOSIS — R1013 Epigastric pain: Secondary | ICD-10-CM | POA: Diagnosis not present

## 2015-02-27 DIAGNOSIS — M26623 Arthralgia of bilateral temporomandibular joint: Secondary | ICD-10-CM

## 2015-02-27 DIAGNOSIS — R002 Palpitations: Secondary | ICD-10-CM

## 2015-02-27 DIAGNOSIS — M797 Fibromyalgia: Secondary | ICD-10-CM

## 2015-02-27 LAB — LIPASE: Lipase: 46 U/L (ref 11.0–59.0)

## 2015-02-27 LAB — CBC WITH DIFFERENTIAL/PLATELET
BASOS PCT: 0.5 % (ref 0.0–3.0)
Basophils Absolute: 0 10*3/uL (ref 0.0–0.1)
EOS PCT: 1.1 % (ref 0.0–5.0)
Eosinophils Absolute: 0.1 10*3/uL (ref 0.0–0.7)
HCT: 37.2 % (ref 36.0–46.0)
Hemoglobin: 12.2 g/dL (ref 12.0–15.0)
LYMPHS ABS: 2.7 10*3/uL (ref 0.7–4.0)
Lymphocytes Relative: 28.6 % (ref 12.0–46.0)
MCHC: 32.7 g/dL (ref 30.0–36.0)
MCV: 81.2 fl (ref 78.0–100.0)
MONO ABS: 0.7 10*3/uL (ref 0.1–1.0)
MONOS PCT: 7.5 % (ref 3.0–12.0)
NEUTROS ABS: 5.8 10*3/uL (ref 1.4–7.7)
NEUTROS PCT: 62.3 % (ref 43.0–77.0)
PLATELETS: 320 10*3/uL (ref 150.0–400.0)
RBC: 4.58 Mil/uL (ref 3.87–5.11)
RDW: 14.5 % (ref 11.5–15.5)
WBC: 9.3 10*3/uL (ref 4.0–10.5)

## 2015-02-27 LAB — COMPREHENSIVE METABOLIC PANEL
ALBUMIN: 4.2 g/dL (ref 3.5–5.2)
ALK PHOS: 52 U/L (ref 39–117)
ALT: 7 U/L (ref 0–35)
AST: 13 U/L (ref 0–37)
BUN: 13 mg/dL (ref 6–23)
CALCIUM: 9.1 mg/dL (ref 8.4–10.5)
CO2: 30 mEq/L (ref 19–32)
CREATININE: 0.81 mg/dL (ref 0.40–1.20)
Chloride: 103 mEq/L (ref 96–112)
GFR: 84.27 mL/min (ref >60.00)
Glucose, Bld: 99 mg/dL (ref 70–99)
Potassium: 3.5 mEq/L (ref 3.5–5.1)
SODIUM: 137 meq/L (ref 135–145)
TOTAL PROTEIN: 7 g/dL (ref 6.0–8.3)
Total Bilirubin: 0.6 mg/dL (ref 0.2–1.2)

## 2015-02-27 LAB — AMYLASE: AMYLASE: 78 U/L (ref 27–131)

## 2015-02-27 MED ORDER — CYCLOBENZAPRINE HCL 5 MG PO TABS: 5.0000 mg | ORAL_TABLET | Freq: Every day | ORAL | Status: DC

## 2015-02-27 MED ORDER — DICLOFENAC SODIUM 75 MG PO TBEC: 75.0000 mg | DELAYED_RELEASE_TABLET | Freq: Two times a day (BID) | ORAL | Status: DC

## 2015-02-27 MED ORDER — RANITIDINE HCL 150 MG PO CAPS: 150.0000 mg | ORAL_CAPSULE | Freq: Two times a day (BID) | ORAL | Status: DC

## 2015-02-27 NOTE — Progress Notes (Signed)
Pre visit review using our clinic review tool, if applicable. No additional management support is needed unless otherwise documented below in the visit note. 

## 2015-02-27 NOTE — Progress Notes (Signed)
Subjective:    Patient ID: Christine Rosales, female    DOB: 11-12-1975, 40 y.o.   MRN: [id number]  HPI   I have reviewed pt PMH, PSH, FH, Social History and Surgical History  Anxiety- In past general anxiety and panic attack that started in 2005. Was pretty serious. When moved to use the symptoms did decrease a lot. She thinks occasional anxiety related to severe tmj.  Possible fibromyalgia. Former dx but she doubts dx. Also had bad reaction to lyrica.  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 This has been problem despite night guards. Pt saw Chief Financial Officer. They ordered xray but will cost $400 dollars. Pt thinks if we order insurance will pay.   When she has severe pain in jaw will have tmj pain.   With states with  Jaw/tmj pain  she will sometimes get trapeizus myalgia and temporal ha. More on left side.   LMP- February 06, 2015.   Pt also mentioned at end interview mild epigastric pain. Some pain after eating. Some pain for 2 wks. See ros.    Palpitation- rare and will take tenormin.   Anxiety- uses xanax about one time a month.   Review of Systems  Constitutional: Negative for fever, chills and fatigue.  Respiratory: Negative for cough, choking, chest tightness, shortness of breath and wheezing.   Cardiovascular: Negative for chest pain and palpitations.  Gastrointestinal: Positive for abdominal pain. Negative for nausea, vomiting, diarrhea, constipation, blood in stool, abdominal distention, anal bleeding and rectal pain.  Musculoskeletal: Negative for back pain.       Tmj area pain.   Neurological: Negative for dizziness, seizures, facial asymmetry, light-headedness and numbness.       Some times temporal ha with trapezius pain and tmj.  Hematological: Negative for adenopathy. Does not bruise/bleed easily.  Psychiatric/Behavioral: Negative for behavioral problems, confusion, self-injury and dysphoric mood. The patient is nervous/anxious.    Past  Medical History  Diagnosis Date  . Anxiety   . Fibromyalgia   . Palpitations     Social History   Social History  . Marital Status: Married    Spouse Name: N/A  . Number of Children: N/A  . Years of Education: N/A   Occupational History  . Not on file.   Social History Main Topics  . Smoking status: Never Smoker   . Smokeless tobacco: Not on file  . Alcohol Use: No  . Drug Use: No  . Sexual Activity: Yes    Birth Control/ Protection: Surgical, Other-see comments     Comment: husband has vascetomy   Other Topics Concern  . Not on file   Social History Narrative    No past surgical history on file.  No family history on file.  Allergies  Allergen Reactions  . Codeine   . Sulfa Antibiotics   . Sudafed [Pseudoephedrine] Rash    Current Outpatient Prescriptions on File Prior to Visit  Medication Sig Dispense Refill  . ALPRAZolam (XANAX) 0.25 MG tablet Take 0.25 mg by mouth at bedtime as needed for anxiety.    Marland Kitchen atenolol (TENORMIN) 25 MG tablet Take 25 mg by mouth daily.     No current facility-administered medications on file prior to visit.    BP 120/72 mmHg  Pulse 93  Temp(Src) 98.1 F (36.7 C) (Oral)  Ht 5\' 4"  (1.626 m)  Wt 120 lb 4 oz (54.545 kg)  BMI 20.63 kg/m2  SpO2 97%  Objective:   Physical Exam   General- No acute distress. Pleasant patient. Neck- Full range of motion, no jvd Trapezius muscle tender to palpation. Lungs- Clear, even and unlabored. Heart- regular rate and rhythm. Neurologic- CNII- XII grossly intact. Jaw eam- tmj joints tender to palpation. Lt side crepitus and seems to deviates some on mastication.   Temporal area- no dilated veins in temporal regions.  Neuro- CN III- XII grossly intact.    Abdomen Inspection:-Inspection Normal.  Palpation/Perucssion: Palpation and Percussion of the abdomen reveal- faint eipgastric Tender, No Rebound tenderness, No rigidity(Guarding) and No Palpable abdominal masses.    Liver:-Normal.  Spleen:- Normal.   Back- no cva tenderness       Assessment & Plan:  For your jaw pain will prescribe diclofenac. Get xrays today on 1st floor.  For your trapezius muscle soreness and probable tension ha will prescribe cyclobenzaprine.  For stomach pain rx ranitidine and eat healthy. Labs to be done today.   If any increase/severe abdomen pain or increase severe HA pain then ED evaluation.  Follow up in 2-3 wks or as needed

## 2015-02-27 NOTE — Patient Instructions (Addendum)
For your jaw pain will prescribe diclofenac. Get xrays today on 1st floor.  For your trapezius muscle soreness and probable tension ha will prescribe cyclobenzaprine.  For stomach pain rx ranitidine and eat healthy. Labs to be done today.   If any increase/severe abdomen pain or increase severe HA pain then ED evaluation.  Follow up in 2-3 wks or as needed

## 2015-03-02 ENCOUNTER — Telehealth: Payer: Self-pay | Admitting: Medical

## 2015-03-02 DIAGNOSIS — R197 Diarrhea, unspecified: Secondary | ICD-10-CM

## 2015-03-02 LAB — H. PYLORI BREATH TEST: H. pylori Breath Test: DETECTED — AB

## 2015-03-02 MED ORDER — CLARITHROMYCIN 500 MG PO TABS: 500.0000 mg | ORAL_TABLET | Freq: Two times a day (BID) | ORAL | Status: DC

## 2015-03-02 MED ORDER — OMEPRAZOLE 20 MG PO CPDR: 20.0000 mg | DELAYED_RELEASE_CAPSULE | Freq: Two times a day (BID) | ORAL | Status: DC

## 2015-03-02 MED ORDER — AMOXICILLIN 875 MG PO TABS: 875.0000 mg | ORAL_TABLET | Freq: Two times a day (BID) | ORAL | Status: DC

## 2015-03-02 NOTE — Telephone Encounter (Signed)
Pt had h pylori +. I advised her on result and treatment that I will send to her pharmacy. She mentioned some recent diarrhea. I advised if by Thursday this week diarrhea persists she can go to our lab to get kit and then turn in samples.

## 2015-03-13 ENCOUNTER — Encounter: Payer: Self-pay | Admitting: Medical

## 2015-03-13 ENCOUNTER — Ambulatory Visit (INDEPENDENT_AMBULATORY_CARE_PROVIDER_SITE_OTHER): Payer: No Typology Code available for payment source | Admitting: Medical

## 2015-03-13 ENCOUNTER — Telehealth: Payer: Self-pay | Admitting: Medical

## 2015-03-13 VITALS — BP 116/76 | HR 78 | Temp 98.1°F | Ht 64.0 in | Wt 124.2 lb

## 2015-03-13 DIAGNOSIS — M26622 Arthralgia of left temporomandibular joint: Secondary | ICD-10-CM

## 2015-03-13 MED ORDER — OMEPRAZOLE 20 MG PO CPDR: 20.0000 mg | DELAYED_RELEASE_CAPSULE | Freq: Every day | ORAL | Status: DC

## 2015-03-13 MED ORDER — DICLOFENAC SODIUM 75 MG PO TBEC: 75.0000 mg | DELAYED_RELEASE_TABLET | Freq: Two times a day (BID) | ORAL | Status: DC

## 2015-03-13 MED ORDER — CYCLOBENZAPRINE HCL 5 MG PO TABS: ORAL_TABLET | ORAL | Status: DC

## 2015-03-13 NOTE — Patient Instructions (Addendum)
For your tmj syndrome , I am prescribing again diclofenac and your cyclobenzaprine. Will try to order MRI of left tmj region.  For Jerrye Bushy will rx 2 more months of omeprazole.  For anxiety continue your sparing use of xanax.   Follow up in in 2 months or as needed.

## 2015-03-13 NOTE — Progress Notes (Signed)
   Subjective:    Patient ID: Christine Rosales, female    DOB: 24-Nov-1975, 40 y.o.   MRN: [id number]  HPI  Pt states that with the diclofenac and the flexeril decreased  pain in left tmj significantly. But now some mild crepitus sound when eating.  Pt overall all has increasing left tmj area pain for 6 years. Increasing symptoms.  Pt stomach pain much better after treatment for h pylori. Pt currently on omeprazole.   Pt states with omeprazole and antibiotic the prior  back pain associated with abdomen pain now resolved.  Pt remind me she has anxiety. She had side effect with SSRI. She only use only 1/2 tablet of xanax twice a month this controls.(States uses for periodic panic attacks)     Review of Systems  Constitutional: Negative for fever, chills and fatigue.  HENT:       Lt tmj area less pain. Not stiff but now crepitus.  Respiratory: Negative for cough, shortness of breath and wheezing.   Cardiovascular: Negative for palpitations.  Gastrointestinal: Negative for nausea, vomiting, abdominal pain, diarrhea and blood in stool.  Psychiatric/Behavioral: Negative for confusion, dysphoric mood and agitation. The patient is nervous/anxious.        Controlled with rare use xanax       Objective:   Physical Exam   General  Mental Status - Alert. General Appearance - Well groomed. Not in acute distress.  Skin Rashes- No Rashes.  HEENT Head- Normal. Ear Auditory Canal - Left- Normal. Right - Normal.Tympanic Membrane- Left- Normal. Right- Normal. Eye Sclera/Conjunctiva- Left- Normal. Right- Normal. Nose & Sinuses Nasal Mucosa- Left-  Not boggy or Congested. Right-  Not  boggy or Congested. Mouth & Throat Lips: Upper Lip- Normal: no dryness, cracking, pallor, cyanosis, or vesicular eruption. Lower Lip-Normal: no dryness, cracking, pallor, cyanosis or vesicular eruption. Buccal Mucosa- Bilateral- No Aphthous ulcers. Oropharynx- No Discharge or Erythema. Tonsils:  Characteristics- Bilateral- No Erythema or Congestion. Size/Enlargement- Bilateral- No enlargement. Discharge- bilateral-None.  Lt tmj- some faint crepitus now on mastication. Mild pain.  Neck Neck- Supple. No Masses.   Chest and Lung Exam Auscultation: Breath Sounds:- even and unlabored  Cardiovascular Auscultation:Rythm- Regular, rate and rhythm. Murmurs & Other Heart Sounds:Ausculatation of the heart reveal- No Murmurs.  Lymphatic Head & Neck General Head & Neck Lymphatics: Bilateral: Description- No Localized lymphadenopathy.  Abdomen Inspection:-Inspection Normal.  Palpation/Perucssion: Palpation and Percussion of the abdomen reveal- Non Tender, No Rebound tenderness, No rigidity(Guarding) and No Palpable abdominal masses.  Liver:-Normal.  Spleen:- Normal.   Back- no cva tenderness   .       Assessment & Plan:  For your tmj syndrome , I am prescribing again  diclofenac and your cyclobenzaprine. Will try to order MRI of left tmj region.  For Jerrye Bushy will rx 2 more months of omeprazole.  For anxiety continue your sparing use of xanax.   Follow up in in 2 months or as needed.

## 2015-03-13 NOTE — Telephone Encounter (Signed)
I put in mri of tmj area left side. I rarely order these. Will you see if gets authorized.

## 2015-03-13 NOTE — Progress Notes (Signed)
Pre visit review using our clinic review tool, if applicable. No additional management support is needed unless otherwise documented below in the visit note. 

## 2015-03-25 ENCOUNTER — Encounter (HOSPITAL_BASED_OUTPATIENT_CLINIC_OR_DEPARTMENT_OTHER): Payer: Self-pay

## 2015-03-25 ENCOUNTER — Emergency Department (HOSPITAL_BASED_OUTPATIENT_CLINIC_OR_DEPARTMENT_OTHER)
Admission: EM | Admit: 2015-03-25 | Discharge: 2015-03-25 | Disposition: A | Discharge status: Home / Self Care | Payer: No Typology Code available for payment source | Attending: Emergency Medicine | Admitting: Emergency Medicine

## 2015-03-25 DIAGNOSIS — R05 Cough: Secondary | ICD-10-CM | POA: Diagnosis present

## 2015-03-25 DIAGNOSIS — Z7951 Long term (current) use of inhaled steroids: Secondary | ICD-10-CM | POA: Diagnosis not present

## 2015-03-25 DIAGNOSIS — J069 Acute upper respiratory infection, unspecified: Secondary | ICD-10-CM | POA: Insufficient documentation

## 2015-03-25 DIAGNOSIS — Z791 Long term (current) use of non-steroidal anti-inflammatories (NSAID): Secondary | ICD-10-CM | POA: Diagnosis not present

## 2015-03-25 DIAGNOSIS — M62830 Muscle spasm of back: Secondary | ICD-10-CM | POA: Diagnosis not present

## 2015-03-25 DIAGNOSIS — F419 Anxiety disorder, unspecified: Secondary | ICD-10-CM | POA: Insufficient documentation

## 2015-03-25 DIAGNOSIS — Z79899 Other long term (current) drug therapy: Secondary | ICD-10-CM | POA: Diagnosis not present

## 2015-03-25 MED ORDER — CETIRIZINE HCL 10 MG PO TABS: 10.0000 mg | ORAL_TABLET | Freq: Every day | ORAL | Status: DC

## 2015-03-25 MED ORDER — FLUTICASONE PROPIONATE 50 MCG/ACT NA SUSP: 1.0000 | Freq: Every day | NASAL | Status: DC

## 2015-03-25 NOTE — ED Notes (Signed)
PA at bedside.

## 2015-03-25 NOTE — Discharge Instructions (Signed)
Calambres y espasmos musculares  (Muscle Cramps and Spasms)  Los calambres musculares y espasmos ocurren cuando un msculo o grupos de msculos se tensan y no se tiene control sobre esta tensin (contraccin muscular involuntaria). Es un problema comn y Audiological scientist en cualquier msculo. La zona ms comn son los msculos de la pantorrilla. Tanto los [employer] espasmos son contracciones musculares involuntarias, pero tambin tienen diferencias:   Los calambres musculares son espordicos y Soil scientist. Pueden durar entre algunos segundos hasta un cuarto de [place]. Los calambres musculares son ms fuertes y duran ms que los espasmos musculares.  Los espasmos pueden o no ser dolorosos. Pueden durar algunos segundos o mucho ms. CAUSAS  No es frecuente que los calambres se deban a un trastorno subyacente grave. En muchos casos, la causa de los calambres y los espasmos es desconocida. Algunas causas frecuentes son:   Esfuerzo excesivo.   El uso excesivo del msculo por movimientos repetitivos (hacer lo mismo una y Elmon Kirschner).   Permanecer en cierta posicin durante un largo perodo de [place].   Preparacin, forma o tcnica inadecuada al realizar un deporte o [place].   Deshidratacin.   Traumatismos.   Efectos secundarios de algunos medicamentos.  Niveles anormalmente bajos de las sales e iones en la sangre (electrolitos), especialmente el potasio y el calcio. Pueden ocurrir cuando se toman pldoras para Garment/textile technologist (diurticos) o en las mujeres embarazadas.  Algunos problemas mdicos subyacentes pueden hacer que sea ms propenso a desarrollar calambres o espasmos. Estos incluyen, pero no se limitan a:   Diabetes.   Enfermedad de Parkinson.   Trastornos hormonales, tales como problemas de la tiroides.   El consumo excesivo de alcohol.   Enfermedades especficas de [employer], las articulaciones y Center.   Enfermedad vascular en la que no llega suficiente sangre  a los msculos.  INSTRUCCIONES PARA EL CUIDADO EN EL HOGAR   Mantngase bien hidratado. Beba gran cantidad de lquido para mantener la orina de tono claro o color amarillo plido.  Puede ser til Community education officer, Neurosurgeon y Media planner el msculo afectado.  Para los msculos tensos o apretados, use una toalla caliente, una almohadilla trmica o agua caliente de la ducha dirigida a la zona afectada.  Si est dolorido o siente dolor despus de un calambre o espasmo, aplique hielo en el rea afectada para Runner, broadcasting/film/video.  Ponga el hielo en una bolsa plstica.  Colquese una toalla entre la piel y la bolsa de hielo.  Deje el hielo en el lugar durante 15 a 20 minutos, 3 a 4 veces por da.  Los medicamentos que se utilizan para tratar las causas conocidas de los calambres o espasmos pueden reducir su frecuencia o gravedad. Tome slo medicamentos de venta libre o recetados, segn las indicaciones del mdico. SOLICITE ATENCIN MDICA SI:  Los calambres o espasmos empeoran, ocurren con ms frecuencia o no mejoran con Physiological scientist.  ASEGRESE DE QUE:   Comprende estas instrucciones.  Controlar su enfermedad.  Solicitar ayuda de inmediato si no mejora o si empeora.   Esta informacin no tiene Marine scientist el consejo del mdico. Asegrese de hacerle al mdico cualquier pregunta que tenga.   Document Released: 10/27/2004 Document Revised: 05/14/2012 Elsevier Interactive Patient Education 2016 [place] tracto respiratorio superior, adultos (Upper Respiratory Infection, Adult) La mayora de las infecciones del tracto respiratorio superior son infecciones virales de las vas que llevan el aire a los pulmones. Un infeccin del tracto respiratorio superior afecta la nariz, la  garganta y las vas respiratorias superiores. El tipo ms frecuente de infeccin del tracto respiratorio superior es la nasofaringitis, que habitualmente se conoce como "resfro comn". Las infecciones del  tracto respiratorio superior siguen su curso y por lo general se curan solas. En la [employer], la infeccin del tracto respiratorio superior no requiere atencin [place], Armed forces training and education officer a veces, despus de una infeccin viral, puede surgir una infeccin bacteriana en las vas respiratorias superiores. Esto se conoce como infeccin secundaria. Las infecciones sinusales y en el odo medio son tipos frecuentes de infecciones secundarias en el tracto respiratorio superior. La neumona bacteriana tambin puede complicar un cuadro de infeccin del tracto respiratorio superior. Este tipo de infeccin puede empeorar el asma y la enfermedad pulmonar obstructiva crnica (EPOC). En algunos casos, estas complicaciones pueden requerir atencin mdica de emergencia y poner en peligro la vida.  CAUSAS Casi todas las infecciones del tracto respiratorio superior se deben a los virus. Un virus es un tipo de microbio que puede contagiarse de Ardelia Mems persona a Theatre manager.  FACTORES DE RIESGO Puede estar en riesgo de sufrir una infeccin del tracto respiratorio superior si:   Fuma.  Tiene una enfermedad pulmonar o cardaca crnica.  Tiene debilitado el sistema de defensa (inmunitario) del cuerpo.  Es muy joven o de edad muy [place].  Tiene asma o alergias nasales.  Trabaja en reas donde hay mucha gente o poca ventilacin.  Governor Rooks en una escuela o en un centro de atencin mdica. SIGNOS Y SNTOMAS  Habitualmente, los sntomas aparecen de 2a 3das despus de entrar en contacto con el virus del resfro. La mayora de las infecciones virales en el tracto respiratorio superior duran de 7a 10das. Sin embargo, las infecciones virales en el tracto respiratorio superior a causa del virus de la gripe pueden durar de 14a 18das y, habitualmente, son ms graves. Entre los sntomas se pueden incluir los siguientes:   Secrecin o congestin nasal.  Estornudos.  Tos.  Dolor de Investment banker, operational.  Dolor de  [place].  Fatiga.  Cristy Hilts.  Prdida del apetito.  Dolor en la frente, detrs de los ojos y por encima de los pmulos (dolor sinusal).  Dolores musculares. DIAGNSTICO  El mdico puede diagnosticar una infeccin del tracto respiratorio superior mediante los siguientes estudios:  Examen fsico.  Pruebas para verificar si los sntomas no se deben a otra afeccin, por ejemplo:  Faringitis estreptoccica.  Sinusitis.  Neumona.  Asma. TRATAMIENTO  Esta infeccin desaparece sola, con el tiempo. No puede curarse con medicamentos, pero a menudo se prescriben para aliviar los sntomas. Los medicamentos pueden ser tiles para lo siguiente:  Engineer, materials fiebre.  Reducir la tos.  Aliviar la congestin nasal. INSTRUCCIONES PARA EL CUIDADO EN EL HOGAR   Tome los medicamentos solamente como se lo haya indicado el mdico.  A fin de Best boy de garganta, haga grgaras con solucin salina templada o consuma caramelos para la tos, como se lo haya indicado el mdico.  Use un humidificador de vapor clido o inhale el vapor de la ducha para aumentar la humedad del aire. Esto facilitar la respiracin.  Beba suficiente lquido para Consulting civil engineer orina clara o de color amarillo plido.  Consuma sopas y otros caldos transparentes, y [employer].  Descanse todo lo que sea necesario.  Regrese al [place] cuando la temperatura se le haya normalizado o cuando el mdico lo autorice. Es posible que deba quedarse en su casa durante un tiempo prolongado, para no infectar a los dems. [employer]  un barbijo y lavarse las manos con cuidado para Mining engineer propagacin del virus.  Aumente el uso del inhalador si tiene asma.  No consuma ningn producto que contenga tabaco, lo que incluye cigarrillos, tabaco de Higher education careers adviser o Psychologist, sport and exercise. Si necesita ayuda para dejar de fumar, consulte al [employer]. PREVENCIN  La mejor manera de protegerse de un resfro es mantener una higiene [place].    Evite el contacto oral o fsico con personas que tengan sntomas de resfro.  En caso de contacto, lvese las manos con frecuencia. No hay pruebas claras de que la vitaminaC, la vitaminaE, la equincea o el ejercicio reduzcan la probabilidad de Museum/gallery curator un resfro. Sin embargo, siempre se recomienda Scientific laboratory technician, hacer ejercicio y Ecologist.  SOLICITE ATENCIN MDICA SI:   Su estado empeora en lugar de mejorar.  Los medicamentos no Animator.  Tiene escalofros.  La sensacin de falta de aire empeora.  Tiene mucosidad marrn o roja.  Tiene secrecin nasal amarilla o marrn.  Le duele la cara, especialmente al inclinarse hacia adelante.  Tiene fiebre.  Tiene los ganglios del cuello hinchados.  Siente dolor al tragar.  Tiene zonas blancas en la parte de atrs de la garganta. SOLICITE ATENCIN MDICA DE INMEDIATO SI:   Tiene sntomas intensos o persistentes de:  Dolor de [place].  Dolor de odos.  Dolor sinusal.  Dolor en el pecho.  Tiene enfermedad pulmonar crnica y cualquiera de estos sntomas:  Sibilancias.  Tos prolongada.  Tos con sangre.  Cambio en la mucosidad habitual.  Presenta rigidez en el cuello.  Tiene cambios en:  La visin.  La audicin.  El pensamiento.  El [place] de nimo. ASEGRESE DE QUE:   Comprende estas instrucciones.  Controlar su afeccin.  Recibir ayuda de inmediato si no mejora o si empeora.   Esta informacin no tiene Marine scientist el consejo del mdico. Asegrese de hacerle al mdico cualquier pregunta que tenga.   Document Released: 10/27/2004 Document Revised: 06/03/2014 Elsevier Interactive Patient Education [employer].

## 2015-03-25 NOTE — ED Notes (Signed)
C/o muscle spams to back x today-had congestion x 3 days-NAD-steady gait

## 2015-03-25 NOTE — ED Provider Notes (Signed)
CSN: [id number]     Arrival date & time 03/25/15  1529 History   First MD Initiated Contact with Patient 03/25/15 1600     Chief Complaint  Patient presents with  . Back Pain     (Consider location/radiation/quality/duration/timing/severity/associated sxs/prior Treatment) HPI Comments: Patient presents to the emergency department with chief complaint of sinus congestion, sneezing, and mild cough. She states that she has been having the symptoms for the past 3 days. She also reports that she has had some muscle spasms in her back. She denies any fever or chills. She has tried taking ibuprofen and Tylenol with no relief. There are no modifying factors. She states that she has been told in the past that her spasms from fibromyalgia. Denies any injuries.  The history is provided by the patient. No language interpreter was used.    Past Medical History  Diagnosis Date  . Anxiety   . Fibromyalgia   . Palpitations    History reviewed. No pertinent past surgical history. No family history on file. Social History  Substance Use Topics  . Smoking status: Never Smoker   . Smokeless tobacco: None  . Alcohol Use: No   OB History    No data available     Review of Systems  Constitutional: Negative for fever and chills.  HENT: Positive for postnasal drip, rhinorrhea, sinus pressure, sneezing and sore throat.   Respiratory: Positive for cough. Negative for shortness of breath.   Cardiovascular: Negative for chest pain.  Gastrointestinal: Negative for nausea, vomiting, abdominal pain, diarrhea and constipation.  Genitourinary: Negative for dysuria.  All other systems reviewed and are negative.     Allergies  Codeine; Sulfa antibiotics; and Sudafed  Home Medications   Prior to Admission medications   Medication Sig Start Date End Date Taking? Authorizing Provider  ALPRAZolam Duanne Moron) 0.25 MG tablet Take 0.25 mg by mouth at bedtime as needed for anxiety.    Historical Provider, MD   atenolol (TENORMIN) 25 MG tablet Take 25 mg by mouth daily.    Historical Provider, MD  cetirizine (ZYRTEC ALLERGY) 10 MG tablet Take 1 tablet (10 mg total) by mouth daily. 03/25/15   Montine Circle, PA-C  diclofenac (VOLTAREN) 75 MG EC tablet Take 1 tablet (75 mg total) by mouth 2 (two) times daily. 03/13/15   Percell Miller Saguier, PA-C  fluticasone (FLONASE) 50 MCG/ACT nasal spray Place 1 spray into both nostrils daily. 03/25/15   Montine Circle, PA-C  omeprazole (PRILOSEC) 20 MG capsule Take 1 capsule (20 mg total) by mouth 2 (two) times daily before a meal. 03/02/15   Mackie Pai, PA-C  omeprazole (PRILOSEC) 20 MG capsule Take 1 capsule (20 mg total) by mouth daily. 03/13/15   Percell Miller Saguier, PA-C  ranitidine (ZANTAC) 150 MG capsule Take 1 capsule (150 mg total) by mouth 2 (two) times daily. 02/27/15   Edward Saguier, PA-C   BP 136/79 mmHg  Pulse 88  Temp(Src) 98.5 F (36.9 C)  Resp 16  Ht 5\' 3"  (1.6 m)  Wt 54.432 kg  BMI 21.26 kg/m2  SpO2 100%  LMP 03/01/2015 Physical Exam  Constitutional: She is oriented to person, place, and time. She appears well-developed and well-nourished. No distress.  HENT:  Head: Normocephalic and atraumatic.  Right Ear: External ear normal.  Left Ear: External ear normal.  Mildly erythematous, no tonsillar exudate, no abscess, no stridor, uvula is midline    Eyes: Conjunctivae and EOM are normal. Pupils are equal, round, and reactive to light. Right eye exhibits  no discharge. Left eye exhibits no discharge. No scleral icterus.  Neck: Normal range of motion. Neck supple. No tracheal deviation present.  Cardiovascular: Normal rate, regular rhythm and normal heart sounds.  Exam reveals no gallop and no friction rub.   No murmur heard. Pulmonary/Chest: Effort normal and breath sounds normal. No stridor. No respiratory distress. She has no wheezes. She has no rales. She exhibits no tenderness.  CTAB  Abdominal: Soft. Bowel sounds are normal. She exhibits no  distension and no mass. There is no tenderness. There is no rebound and no guarding.  Musculoskeletal: Normal range of motion. She exhibits no edema or tenderness.  No paraspinal muscles tender to palpation, no bony tenderness, step-offs, or gross abnormality or deformity of spine, patient is able to ambulate, moves all extremities   Neurological: She is alert and oriented to person, place, and time.  Sensation and strength intact bilaterally   Skin: Skin is warm and dry. No rash noted. She is not diaphoretic.  Psychiatric: She has a normal mood and affect. Her behavior is normal. Judgment and thought content normal.  Nursing note and vitals reviewed.   ED Course  Procedures (including critical care time)   MDM   Final diagnoses:  URI (upper respiratory infection)  Muscle spasm of back    Patients symptoms are consistent with URI, likely viral etiology. Discussed that antibiotics are not indicated for viral infections. Pt will be discharged with symptomatic treatment.  Verbalizes understanding and is agreeable with plan. Pt is hemodynamically stable & in NAD prior to dc.     Montine Circle, PA-C 03/25/15 [place], MD 03/25/15 [phone]

## 2015-03-26 ENCOUNTER — Encounter: Payer: Self-pay | Admitting: Medical

## 2015-03-26 ENCOUNTER — Ambulatory Visit (INDEPENDENT_AMBULATORY_CARE_PROVIDER_SITE_OTHER): Payer: No Typology Code available for payment source | Admitting: Medical

## 2015-03-26 VITALS — BP 110/78 | HR 88 | Temp 98.4°F | Ht 64.0 in | Wt 124.2 lb

## 2015-03-26 DIAGNOSIS — S46819A Strain of other muscles, fascia and tendons at shoulder and upper arm level, unspecified arm, initial encounter: Secondary | ICD-10-CM | POA: Diagnosis not present

## 2015-03-26 DIAGNOSIS — J0100 Acute maxillary sinusitis, unspecified: Secondary | ICD-10-CM

## 2015-03-26 DIAGNOSIS — J309 Allergic rhinitis, unspecified: Secondary | ICD-10-CM | POA: Diagnosis not present

## 2015-03-26 MED ORDER — LEVOCETIRIZINE DIHYDROCHLORIDE 5 MG PO TABS: 5.0000 mg | ORAL_TABLET | Freq: Every evening | ORAL | Status: DC

## 2015-03-26 MED ORDER — AZITHROMYCIN 250 MG PO TABS: ORAL_TABLET | ORAL | Status: DC

## 2015-03-26 MED ORDER — AZELASTINE HCL 0.1 % NA SOLN: 2.0000 | Freq: Two times a day (BID) | NASAL | Status: DC

## 2015-03-26 NOTE — Progress Notes (Signed)
Subjective:    Patient ID: Christine Rosales, female    DOB: 01-06-1976, 40 y.o.   MRN: [id number]  HPI  Since Friday has been sneezing a lot, nasal congestion, and sinus pressure. With this she felt some trapezius tenderness.   Pt trapezius tenderness was severe. She went to ED. They diagnosed with URI. They did not think antibiotics were indicated. They did not treat her trapezius pain. Pt went home.She took one elavil(her son tablet) and she states pain went away. Today some faint tenderness. Pt mentioned she expected injection. In past got toradol and helped her neck pain a lot.  LMP- March 01, 2015.  Review of Systems  Constitutional: Negative for fever, chills and fatigue.  HENT: Positive for congestion, sinus pressure and sneezing.   Eyes: Negative for pain.  Respiratory: Negative for cough, choking, chest tightness, shortness of breath and wheezing.   Cardiovascular: Negative for chest pain and palpitations.  Musculoskeletal: Negative for myalgias and back pain.       Put trapezius myalgia  Neurological: Negative for dizziness and headaches.  Hematological: Positive for adenopathy. Does not bruise/bleed easily.    Past Medical History  Diagnosis Date  . Anxiety   . Fibromyalgia   . Palpitations     Social History   Social History  . Marital Status: Married    Spouse Name: N/A  . Number of Children: N/A  . Years of Education: N/A   Occupational History  . Not on file.   Social History Main Topics  . Smoking status: Never Smoker   . Smokeless tobacco: Not on file  . Alcohol Use: No  . Drug Use: No  . Sexual Activity: Yes    Birth Control/ Protection: Surgical, Other-see comments     Comment: husband has vascetomy   Other Topics Concern  . Not on file   Social History Narrative    No past surgical history on file.  No family history on file.  Allergies  Allergen Reactions  . Codeine   . Sulfa Antibiotics   . Sudafed [Pseudoephedrine] Rash     Current Outpatient Prescriptions on File Prior to Visit  Medication Sig Dispense Refill  . ALPRAZolam (XANAX) 0.25 MG tablet Take 0.25 mg by mouth at bedtime as needed for anxiety.    Marland Kitchen atenolol (TENORMIN) 25 MG tablet Take 25 mg by mouth daily.    . cetirizine (ZYRTEC ALLERGY) 10 MG tablet Take 1 tablet (10 mg total) by mouth daily. 30 tablet 1  . diclofenac (VOLTAREN) 75 MG EC tablet Take 1 tablet (75 mg total) by mouth 2 (two) times daily. 30 tablet 1  . fluticasone (FLONASE) 50 MCG/ACT nasal spray Place 1 spray into both nostrils daily. 16 g 0  . omeprazole (PRILOSEC) 20 MG capsule Take 1 capsule (20 mg total) by mouth 2 (two) times daily before a meal. 60 capsule 3  . omeprazole (PRILOSEC) 20 MG capsule Take 1 capsule (20 mg total) by mouth daily. 30 capsule 1  . ranitidine (ZANTAC) 150 MG capsule Take 1 capsule (150 mg total) by mouth 2 (two) times daily. 60 capsule 0   No current facility-administered medications on file prior to visit.    BP 110/78 mmHg  Pulse 88  Temp(Src) 98.4 F (36.9 C) (Oral)  Ht 5\' 4"  (1.626 m)  Wt 124 lb 3.2 oz (56.337 kg)  BMI 21.31 kg/m2  SpO2 98%  LMP 03/01/2015       Objective:   Physical Exam  General  Mental Status - Alert. General Appearance - Well groomed. Not in acute distress.  Skin Rashes- No Rashes.  HEENT Head- Normal. Ear Auditory Canal - Left- Normal. Right - Normal.Tympanic Membrane- Left- Normal. Right- Normal. Eye Sclera/Conjunctiva- Left- Normal. Right- Normal. Nose & Sinuses Nasal Mucosa- Left-  Boggy and Congested. Right-  Boggy and  Congested.Bilateral maxillary and frontal sinus pressure. Mouth & Throat Lips: Upper Lip- Normal: no dryness, cracking, pallor, cyanosis, or vesicular eruption. Lower Lip-Normal: no dryness, cracking, pallor, cyanosis or vesicular eruption. Buccal Mucosa- Bilateral- No Aphthous ulcers. Oropharynx- No Discharge or Erythema. Tonsils: Characteristics- Bilateral- No Erythema or  Congestion. Size/Enlargement- Bilateral- No enlargement. Discharge- bilateral-None.  Neck Neck- Supple. No Masses. No lymphadenopathy.no rigidity. Mild trapezius tenderness presently.   Chest and Lung Exam Auscultation: Breath Sounds:-Clear even and unlabored.  Cardiovascular Auscultation:Rythm- Regular, rate and rhythm. Murmurs & Other Heart Sounds:Ausculatation of the heart reveal- No Murmurs.  Lymphatic Head & Neck General Head & Neck Lymphatics: Bilateral: Description- No Localized lymphadenopathy.      Assessment & Plan:  For allergies can start xyzal and start astelin.  If you sinus pressure persists despite above then start azithromycin over the weekend.  For trapezius muscle soreness. Will give toradol 60 mg im.  Follow up 7 days or as needed.

## 2015-03-26 NOTE — Patient Instructions (Addendum)
For allergies can start xyzal and start astelin.  If you sinus pressure persists despite above then start azithromycin over the weekend.  For trapezius muscle soreness. Will give toradol 60 mg im.  Follow up 7 days or as needed.

## 2015-03-26 NOTE — Progress Notes (Signed)
Pre visit review using our clinic review tool, if applicable. No additional management support is needed unless otherwise documented below in the visit note. 

## 2015-03-28 ENCOUNTER — Ambulatory Visit (HOSPITAL_BASED_OUTPATIENT_CLINIC_OR_DEPARTMENT_OTHER)
Admission: RE | Admit: 2015-03-28 | Discharge: 2015-03-28 | Disposition: A | Discharge status: Home / Self Care | Payer: No Typology Code available for payment source | Source: Ambulatory Visit | Attending: Medical | Admitting: Medical

## 2015-03-28 DIAGNOSIS — M26622 Arthralgia of left temporomandibular joint: Secondary | ICD-10-CM | POA: Insufficient documentation

## 2015-04-07 ENCOUNTER — Encounter (HOSPITAL_COMMUNITY): Payer: Self-pay | Admitting: Psychiatry

## 2015-04-07 ENCOUNTER — Ambulatory Visit (INDEPENDENT_AMBULATORY_CARE_PROVIDER_SITE_OTHER): Payer: No Typology Code available for payment source | Admitting: Psychiatry

## 2015-04-07 ENCOUNTER — Encounter (INDEPENDENT_AMBULATORY_CARE_PROVIDER_SITE_OTHER): Payer: Self-pay

## 2015-04-07 VITALS — BP 118/66 | HR 78 | Ht 64.0 in | Wt 124.4 lb

## 2015-04-07 DIAGNOSIS — F410 Panic disorder [episodic paroxysmal anxiety] without agoraphobia: Secondary | ICD-10-CM

## 2015-04-07 DIAGNOSIS — F411 Generalized anxiety disorder: Secondary | ICD-10-CM

## 2015-04-07 MED ORDER — ALPRAZOLAM 0.25 MG PO TABS: 0.2500 mg | ORAL_TABLET | Freq: Every evening | ORAL | Status: DC | PRN

## 2015-04-07 MED ORDER — MIRTAZAPINE 15 MG PO TABS: 15.0000 mg | ORAL_TABLET | Freq: Every day | ORAL | Status: DC
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
## 2015-05-11 ENCOUNTER — Ambulatory Visit (HOSPITAL_COMMUNITY): Payer: Self-pay | Admitting: Psychology

## 2015-05-11 ENCOUNTER — Encounter (HOSPITAL_COMMUNITY): Payer: Self-pay | Admitting: Psychology

## 2015-05-12 NOTE — Progress Notes (Signed)
Christine Rosales is a 40 y.o. female patient who didn't show for today's assessment appointment to establish counseling.        Jan Fireman, LPC

## 2015-06-09 ENCOUNTER — Ambulatory Visit (INDEPENDENT_AMBULATORY_CARE_PROVIDER_SITE_OTHER): Payer: No Typology Code available for payment source | Admitting: Psychiatry

## 2015-06-09 ENCOUNTER — Encounter (HOSPITAL_COMMUNITY): Payer: Self-pay | Admitting: Psychiatry

## 2015-06-09 VITALS — BP 110/68 | HR 66 | Ht 64.0 in | Wt 125.4 lb

## 2015-06-09 DIAGNOSIS — G4700 Insomnia, unspecified: Secondary | ICD-10-CM

## 2015-06-09 DIAGNOSIS — F410 Panic disorder [episodic paroxysmal anxiety] without agoraphobia: Secondary | ICD-10-CM

## 2015-06-09 DIAGNOSIS — F411 Generalized anxiety disorder: Secondary | ICD-10-CM
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
## 2015-06-25 ENCOUNTER — Encounter: Payer: Self-pay | Admitting: Medical

## 2015-06-25 ENCOUNTER — Ambulatory Visit (INDEPENDENT_AMBULATORY_CARE_PROVIDER_SITE_OTHER): Payer: No Typology Code available for payment source | Admitting: Medical

## 2015-06-25 VITALS — BP 110/70 | HR 89 | Temp 98.0°F | Ht 64.0 in | Wt 120.0 lb

## 2015-06-25 DIAGNOSIS — J029 Acute pharyngitis, unspecified: Secondary | ICD-10-CM | POA: Diagnosis not present

## 2015-06-25 DIAGNOSIS — J0100 Acute maxillary sinusitis, unspecified: Secondary | ICD-10-CM

## 2015-06-25 DIAGNOSIS — R059 Cough, unspecified: Secondary | ICD-10-CM

## 2015-06-25 DIAGNOSIS — R05 Cough: Secondary | ICD-10-CM | POA: Diagnosis not present

## 2015-06-25 LAB — POCT RAPID STREP A (OFFICE): RAPID STREP A SCREEN: NEGATIVE

## 2015-06-25 MED ORDER — FLUTICASONE PROPIONATE 50 MCG/ACT NA SUSP: 2.0000 | Freq: Every day | NASAL | Status: DC

## 2015-06-25 MED ORDER — CYCLOBENZAPRINE HCL 10 MG PO TABS: 10.0000 mg | ORAL_TABLET | Freq: Every day | ORAL | Status: DC

## 2015-06-25 MED ORDER — AZITHROMYCIN 250 MG PO TABS: ORAL_TABLET | ORAL | Status: DC

## 2015-06-25 MED ORDER — BENZONATATE 100 MG PO CAPS: 100.0000 mg | ORAL_CAPSULE | Freq: Three times a day (TID) | ORAL | Status: DC | PRN

## 2015-06-25 NOTE — Progress Notes (Signed)
Pre visit review using our clinic review tool, if applicable. No additional management support is needed unless otherwise documented below in the visit note. 

## 2015-06-25 NOTE — Patient Instructions (Addendum)
Your strep test was negative. However, your physical exam and clinical presentation is suspicious for strep and it is important to note that rapid strep test can be falsely negative. So I am going to give you azithromycin  antibiotic today based on your exam and clinical presentation.  Appear to have sinus infection as well. Azithromycin should help as well For nasal congestion rx flonase.  For any cough  benzonatate  Rest hydrate, tylenol for fever, and warm salt water gargles.   Follow up in 7 days or as needed.

## 2015-06-25 NOTE — Progress Notes (Addendum)
Subjective:    Patient ID: Christine Rosales, female    DOB: 01-01-1976, 40 y.o.   MRN: [id number]  HPI  Pt in with possible allergies since February.   Last Friday sore throat. Mild rough feeling. Pt has been congested. Pt has been taking tylenol, advil and gargle with warm salt water.  Last night low grade fever.   Pt has mild left tonsil discharge.  Some body aches and weakness.  Pt felt transient wheezing. Today felt today as well.  Some cough recently. Some left side back pain as well.    Review of Systems  Constitutional: Positive for fever. Negative for chills and fatigue.  HENT: Positive for congestion, postnasal drip, sinus pressure and sore throat.   Respiratory: Positive for cough and wheezing. Negative for chest tightness and shortness of breath.        Transient yesterday.  Cardiovascular: Negative for chest pain and palpitations.  Musculoskeletal: Positive for myalgias.  Neurological: Negative for dizziness, speech difficulty and headaches.  Hematological: Positive for adenopathy.  Psychiatric/Behavioral: Negative for behavioral problems and confusion.    Past Medical History  Diagnosis Date  . Anxiety   . Palpitations   . Fibromyalgia   . Headache   . TMJ (dislocation of temporomandibular joint)      Social History   Social History  . Marital Status: Married    Spouse Name: N/A  . Number of Children: N/A  . Years of Education: N/A   Occupational History  . Not on file.   Social History Main Topics  . Smoking status: Never Smoker   . Smokeless tobacco: Never Used  . Alcohol Use: No  . Drug Use: No  . Sexual Activity: Yes    Birth Control/ Protection: Surgical, Other-see comments     Comment: husband has vascetomy   Other Topics Concern  . Not on file   Social History Narrative   Born and raised in [place] by parents. Pt has one older sister. Mom suffered from depression and it affected how she raised the pt. Pt has a college  degree in Associate Emergency Medicine. Pt moved to [place] in 2009 due to her husband's job. Pt has bee married 14 yrs and has 2 kids. 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     Past Surgical History  Procedure Laterality Date  . No past surgeries      Family History  Problem Relation Age of Onset  . Depression Mother   . Depression Sister     Allergies  Allergen Reactions  . Codeine   . Sulfa Antibiotics   . Sudafed [Pseudoephedrine] Rash    Current Outpatient Prescriptions on File Prior to Visit  Medication Sig Dispense Refill  . atenolol (TENORMIN) 25 MG tablet Take 25 mg by mouth daily.    . baclofen (LIORESAL) 10 MG tablet Take 10 mg by mouth 2 (two) times daily. Reported on 06/09/2015    . diclofenac (VOLTAREN) 75 MG EC tablet Take 1 tablet (75 mg total) by mouth 2 (two) times daily. 30 tablet 1  . levocetirizine (XYZAL) 5 MG tablet Take 1 tablet (5 mg total) by mouth every evening. 30 tablet 0  . omeprazole (PRILOSEC) 20 MG capsule Take 1 capsule (20 mg total) by mouth 2 (two) times daily before a meal. 60 capsule 3   No current facility-administered medications on file prior to visit.    BP 110/70 mmHg  Pulse 89  Temp(Src) 98 F (36.7 C) (Oral)  Ht 5\' 4"  (1.626 m)  Wt 120 lb (54.432 kg)  BMI 20.59 kg/m2  SpO2 98%  LMP 06/08/2015       Objective:   Physical Exam  General  Mental Status - Alert. General Appearance - Well groomed. Not in acute distress.  Skin Rashes- No Rashes.  HEENT Head- Normal. Ear Auditory Canal - Left- Normal. Right - Normal.Tympanic Membrane- Left- Normal. Right- Normal. Eye Sclera/Conjunctiva- Left- Normal. Right- Normal. Nose & Sinuses Nasal Mucosa- Left-  Boggy and Congested. Right-  Boggy and  Congested.Lt side  maxillary but no frontal sinus pressure. Mouth & Throat Lips: Upper Lip- Normal: no dryness, cracking, pallor, cyanosis, or vesicular eruption. Lower Lip-Normal: no dryness,  cracking, pallor, cyanosis or vesicular eruption. Buccal Mucosa- Bilateral- No Aphthous ulcers. Oropharynx- No Discharge or Erythema. Tonsils: Characteristics- Bilateral- Mild  Erythema and  Congestion. Size/Enlargement- Bilateral- No enlargement. Discharge- bilateral-None.  Neck Neck- Supple. No Masses. Lt submandibular node moderate enlarged and tender.   Chest and Lung Exam Auscultation: Breath Sounds:-Clear even and unlabored.  Cardiovascular Auscultation:Rythm- Regular, rate and rhythm. Murmurs & Other Heart Sounds:Ausculatation of the heart reveal- No Murmurs.  Lymphatic Head & Neck General Head & Neck Lymphatics: Bilateral: Description- see neck exam.       Assessment & Plan:  Your strep test was negative. However, your physical exam and clinical presentation is suspicious for strep and it is important to note that rapid strep test can be falsely negative. So I am going to give you azithromycin  antibiotic today based on your exam and clinical presentation.  Appear to have sinus infection as well. Azithromycin should help as well For nasal congestion rx flonase. For any cough  benzonatate   Rest hydrate, tylenol for fever, and warm salt water gargles.   Follow up in 7 days or as needed.    Christine Rosales, Percell Miller, PA-C   Note discussed her tmj at the end. She requested more flexeril. She uses when has tense trapezius and when her tmj are pain flares. So I refilled today.

## 2015-06-25 NOTE — Addendum Note (Signed)
Addended by: Anabel Halon on: 06/25/2015 06:57 PM   Modules accepted: Orders

## 2015-10-14 ENCOUNTER — Other Ambulatory Visit: Payer: Self-pay | Admitting: Medical

## 2015-10-14 ENCOUNTER — Telehealth: Payer: Self-pay | Admitting: Medical

## 2015-10-14 ENCOUNTER — Ambulatory Visit (INDEPENDENT_AMBULATORY_CARE_PROVIDER_SITE_OTHER): Payer: No Typology Code available for payment source | Admitting: Medical

## 2015-10-14 ENCOUNTER — Encounter: Payer: Self-pay | Admitting: Medical

## 2015-10-14 VITALS — BP 126/85 | HR 92 | Temp 98.7°F | Ht 64.0 in | Wt 123.0 lb

## 2015-10-14 DIAGNOSIS — N910 Primary amenorrhea: Secondary | ICD-10-CM

## 2015-10-14 DIAGNOSIS — N926 Irregular menstruation, unspecified: Secondary | ICD-10-CM

## 2015-10-14 DIAGNOSIS — R002 Palpitations: Secondary | ICD-10-CM

## 2015-10-14 LAB — POCT URINE PREGNANCY: PREG TEST UR: NEGATIVE

## 2015-10-14 MED ORDER — HYDROXYZINE HCL 25 MG PO TABS: ORAL_TABLET | ORAL | 0 refills | Status: DC

## 2015-10-14 NOTE — Telephone Encounter (Signed)
Pt urine is in bathroom. Will you do a pregnancy test on it and let me know results.

## 2015-10-14 NOTE — Progress Notes (Signed)
   Subjective:    Patient ID: Christine Rosales, female    DOB: [birth year], 40 y.o.   MRN: [id number]  HPI  Pt in for recent palpitations. Pt states recently had palpitation 3 times. One time in July on vacation she was walking and felt rapid heart rate. She got back home and took atenolol and her pulse was 110. But initially her machine red error before read 110. Other 2 times was at rest. She felt heart rate increase. Pt states could see her pulse in her rest.   Pt states on 09-16-2015 had similar event as above. Went to urgent Care. No ekg done that day. They gave her propranol. Pt when uses will resolve symptoms fasting.  Pt is convinced not panic attack. Although she has hx of stress. Pt states behavioral health talked to her and she gets anxious xanax clears from system. 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 Work done by [employer] per her report. Pt has used vistaril in the past and no reaction.  Pt also states August 25, 2015 was for 10 days. Then since august has not had. Pt mentioned some hot flashes random. Pt never had hysterectomy.  Pt does not have gyn. Has appointment in October. Pt not sure if he speaks.   Pt give update on her chronic tmj pain. Pain has seen specialist and plan is in place for treatment.    Review of Systems  Constitutional: Negative for chills, fatigue and fever.  Respiratory: Negative for cough, chest tightness, shortness of breath and wheezing.   Cardiovascular: Positive for palpitations. Negative for chest pain.       Not now. Last one was 2 days. Last about one hour. Resolved with propranol.  Pt tried bupsar as well. She was told by behavioral health not to take.  Gastrointestinal: Negative for abdominal pain.  Genitourinary: Positive for menstrual problem. Negative for dysuria, flank pain and urgency.  Musculoskeletal: Negative for back pain.  Neurological: Negative for dizziness and headaches.  Hematological: Negative for adenopathy.  Does not bruise/bleed easily.  Psychiatric/Behavioral: Negative for behavioral problems. The patient is nervous/anxious.        Objective:   Physical Exam  General Mental Status- Alert. General Appearance- Not in acute distress.   Skin General: Color- Normal Color. Moisture- Normal Moisture.  Neck Carotid Arteries- Normal color. Moisture- Normal Moisture. No carotid bruits. No JVD.  Chest and Lung Exam Auscultation: Breath Sounds:-Normal.  Cardiovascular Auscultation:Rythm- Regular. Murmurs & Other Heart Sounds:Auscultation of the heart reveals- No Murmurs.  Abdomen Inspection:-Inspeection Normal. Palpation/Percussion:Note:No mass. Palpation and Percussion of the abdomen reveal- Non Tender, Non Distended + BS, no rebound or guarding.    Neurologic Cranial Nerve exam:- CN III-XII intact(No nystagmus), symmetric smile. Strength:- 5/5 equal and symmetric strength both upper and lower extremities.      Assessment & Plan:  ekg showed nsr.  For your palpitation will advise that you continue propanolol as needed. But will also refer you to cardiologist for likely holter/work up.  For anxiety and panic attacks will make hydroxyzine available. Since you have ssri and benzodiazapine reaction.  For your irregular cycles recents and late menses we did pregnancy test. Will refer you to gyn. Will try  Dr. Toney Rakes.   Follow up 10-14 days or as needed

## 2015-10-14 NOTE — Patient Instructions (Addendum)
For your palpitation will advise that you continue propanolol as needed. But will also refer you to cardiologist for likely holter/work up.  For anxiety and panic attacks will make hydroxyzine available. Since you have ssri and benzodiazapine reaction.  For your irregular cycles recents and late menses we did pregnancy test. Will refer you to gyn. Will try  Dr. Toney Rakes.   Follow up 10-14 days or as needed

## 2015-10-20 ENCOUNTER — Ambulatory Visit: Payer: Self-pay | Admitting: Gynecology

## 2015-10-20 DIAGNOSIS — Z0289 Encounter for other administrative examinations: Secondary | ICD-10-CM

## 2015-10-23 NOTE — Telephone Encounter (Signed)
Urine pregnancy negative  

## 2015-11-13 ENCOUNTER — Encounter: Payer: Self-pay | Admitting: Internal Medicine

## 2015-11-13 ENCOUNTER — Ambulatory Visit (INDEPENDENT_AMBULATORY_CARE_PROVIDER_SITE_OTHER): Payer: No Typology Code available for payment source | Admitting: Internal Medicine

## 2015-11-13 VITALS — BP 130/70 | HR 73 | Ht 64.0 in | Wt 124.4 lb

## 2015-11-13 DIAGNOSIS — R002 Palpitations: Secondary | ICD-10-CM | POA: Diagnosis not present

## 2015-11-13 DIAGNOSIS — R079 Chest pain, unspecified: Secondary | ICD-10-CM

## 2015-11-13 LAB — CBC WITH DIFFERENTIAL/PLATELET
BASOS PCT: 0 %
Basophils Absolute: 0 cells/uL (ref 0–200)
EOS ABS: 77 {cells}/uL (ref 15–500)
Eosinophils Relative: 1 %
HCT: 35.4 % (ref 35.0–45.0)
Hemoglobin: 11.7 g/dL (ref 11.7–15.5)
LYMPHS PCT: 36 %
Lymphs Abs: 2772 cells/uL (ref 850–3900)
MCH: 25.7 pg — ABNORMAL LOW (ref 27.0–33.0)
MCHC: 33.1 g/dL (ref 32.0–36.0)
MCV: 77.8 fL — AB (ref 80.0–100.0)
MONOS PCT: 9 %
MPV: 10.4 fL (ref 7.5–12.5)
Monocytes Absolute: 693 cells/uL (ref 200–950)
Neutro Abs: 4158 cells/uL (ref 1500–7800)
Neutrophils Relative %: 54 %
PLATELETS: 377 10*3/uL (ref 140–400)
RBC: 4.55 MIL/uL (ref 3.80–5.10)
RDW: 16.1 % — AB (ref 11.0–15.0)
WBC: 7.7 10*3/uL (ref 3.8–10.8)

## 2015-11-13 LAB — TSH: TSH: 0.79 mIU/L

## 2015-11-13 LAB — BASIC METABOLIC PANEL
BUN: 13 mg/dL (ref 7–25)
CALCIUM: 9.5 mg/dL (ref 8.6–10.2)
CO2: 27 mmol/L (ref 20–31)
Chloride: 102 mmol/L (ref 98–110)
Creat: 0.79 mg/dL (ref 0.50–1.10)
Glucose, Bld: 79 mg/dL (ref 65–99)
Potassium: 4 mmol/L (ref 3.5–5.3)
SODIUM: 140 mmol/L (ref 135–146)

## 2015-11-13 LAB — MAGNESIUM: MAGNESIUM: 1.9 mg/dL (ref 1.5–2.5)

## 2015-11-13 NOTE — Progress Notes (Signed)
New Outpatient Visit Date: 11/13/2015  Referring Provider: Mackie Pai, PA-C [address] [place], [place] [address]  Chief Complaint: Palpitations  HPI:  [name] is a 40 y.o. year-old female with history of anxiety and fibromyalgia, who has been referred by Mr. Harvie Heck for evaluation of palpitations. She is seen today with a Spanish interpreter. The patient reports at least 3 episodes of severe palpitations over the last 6 months. She notes "tachycardia" many nights, which can last minutes to hours. The most severe episode occurred while walking across the street this summer. It was accompanied by chest pain, shortness of breath, and lightheadedness. The patient reports that she took her blood pressure shortly thereafter and found it to be very elevated at 200/143. Her heart rate was also high, though she does not recall the exact number. She took atenolol and Xanax, after which she notes her blood pressure had improved. She has had at least 2 similar episodes since that time, lasting up to 2 hours. The most recent episode was last week. She has never passed out. She has used her husband's CPAP machine during one of these episodes because of her shortness of breath and notes some modest improvement. The patient reports that she was diagnosed with serotonin syndrome after being started on a very low dose anti-anxiety medication. The patient consumes at least one 20 ounce soda a day and one cup of coffee 3-4 days per week. She reports that during a GI illness earlier in the year, she did not consume any caffeine and had complete resolution of her chest pain and palpitations during that time.  The patient has experienced palpitations off and on for many years and underwent evaluation in [place] in 2005. She reports undergoing a treadmill stress test, echocardiogram, and Holter monitor. She was started on prn beta blockers (has used atenolol and propranolol interchangeably)  with improvement in the symptoms. However, she does not like to take these medications regularly because they make her tired and slow her heart rate into the 50s.  The patient also reports chronic pain in the left chest located just below the breast and radiating to her back. It is worsened by lying down. It improves with pressure applied to the chest wall as well as exertion. This has been present almost continuously for several months. She denies prior injury to this area and feels as though it is within the chest rather than on the outside. She also believes that the episodic palpitations and accompanying chest tightness, shortness of breath, and lightheadedness are different from her typical panic attacks.  --------------------------------------------------------------------------------------------------  Cardiovascular History & Procedures: Cardiovascular Problems:  Palpitations  Risk Factors:  None  Cath/PCI:  None  CV Surgery:  None  EP Procedures and Devices:  None  Non-Invasive Evaluation(s):  None  Recent CV Pertinent Labs: Lab Results  Component Value Date   K 3.5 02/27/2015   BUN 13 02/27/2015   CREATININE 0.81 02/27/2015    --------------------------------------------------------------------------------------------------  Past Medical History:  Diagnosis Date  . Anxiety   . Fibromyalgia   . Headache   . Palpitations   . TMJ (dislocation of temporomandibular joint)     Past Surgical History:  Procedure Laterality Date  . NO PAST SURGERIES      Outpatient Encounter Prescriptions as of 11/13/2015  Medication Sig  . atenolol (TENORMIN) 25 MG tablet Take 25 mg by mouth daily.  . baclofen (LIORESAL) 10 MG tablet Take 10 mg by mouth 2 (  two) times daily. Reported on 06/09/2015  . benzonatate (TESSALON) 100 MG capsule Take 1 capsule (100 mg total) by mouth 3 (three) times daily as needed.  . cyclobenzaprine (FLEXERIL) 10 MG tablet Take 1 tablet (10 mg  total) by mouth at bedtime.  . diclofenac (VOLTAREN) 75 MG EC tablet Take 1 tablet (75 mg total) by mouth 2 (two) times daily.  . fluticasone (FLONASE) 50 MCG/ACT nasal spray Place 2 sprays into both nostrils daily.  . hydrOXYzine (ATARAX/VISTARIL) 25 MG tablet 1 tab po as needed severe anxiety or panic attack.(max total in any one day 3 tab and 8 hours apart)  . levocetirizine (XYZAL) 5 MG tablet Take 1 tablet (5 mg total) by mouth every evening.  Marland Kitchen omeprazole (PRILOSEC) 20 MG capsule Take 1 capsule (20 mg total) by mouth 2 (two) times daily before a meal.  . propranolol (INDERAL) 20 MG tablet Take 20 mg by mouth daily as needed (rapid heartrate).    No facility-administered encounter medications on file as of 11/13/2015.     Allergies: Codeine; Sudafed [pseudoephedrine]; and Sulfa antibiotics  Social History   Social History  . Marital status: Married    Spouse name: N/A  . Number of children: N/A  . Years of education: N/A   Occupational History  . Not on file.   Social History Main Topics  . Smoking status: Never Smoker  . Smokeless tobacco: Never Used  . Alcohol use No  . Drug use: No  . Sexual activity: Yes    Birth control/ protection: Surgical, Other-see comments     Comment: husband has vascetomy   Other Topics Concern  . Not on file   Social History Narrative   Born and raised in [place] by parents. Pt has one older sister. Mom suffered from depression and it affected how she raised the pt. Pt has a college degree in Associate Emergency Medicine. Pt moved to [place] in 2009 due to her husband's job. Pt has bee married 14 yrs and has 2 kids. 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     Family History  Problem Relation Age of Onset  . Depression Mother   . Depression Sister     Review of Systems: A 12-system review of systems was performed and was negative except as noted in the  HPI.  --------------------------------------------------------------------------------------------------  Physical Exam: BP 130/70   Pulse 73   Ht 5\' 4"  (1.626 m)   Wt 124 lb 6.4 oz (56.4 kg)   LMP 10/25/2015   BMI 21.35 kg/m   General:  Well-developed, well-nourished woman seated comfortably in the exam room. She is accompanied by a [place] interpreter. HEENT: No conjunctival pallor or scleral icterus.  Moist mucous membranes.  OP clear. Neck: Supple without lymphadenopathy, thyromegaly, JVD, or HJR.  No carotid bruit. Lungs: Normal work of breathing.  Clear to auscultation bilaterally without wheezes or crackles. Heart: Regular rate and rhythm without murmurs, rubs, or gallops.  Non-displaced PMI. Abd: Bowel sounds present.  Soft, NT/ND without hepatosplenomegaly Ext: No lower extremity edema.  Radial, PT, and DP pulses are 2+ bilaterally Skin: warm and dry without rash Neuro: CNIII-XII intact.  Strength and fine-touch sensation intact in upper and lower extremities bilaterally. Psych: Normal mood and affect.  EKG:  Normal sinus rhythm with sinus arrhythmia. Nonspecific T-wave changes. U-wave present. Prior EKG on 10/14/15 was unremarkable without nonspecific T-wave changes or U wave.  Lab Results  Component Value Date  WBC 9.3 02/27/2015   HGB 12.2 02/27/2015   HCT 37.2 02/27/2015   MCV 81.2 02/27/2015   PLT 320.0 02/27/2015    Lab Results  Component Value Date   NA 137 02/27/2015   K 3.5 02/27/2015   CL 103 02/27/2015   CO2 30 02/27/2015   BUN 13 02/27/2015   CREATININE 0.81 02/27/2015   GLUCOSE 99 02/27/2015   ALT 7 02/27/2015    --------------------------------------------------------------------------------------------------  ASSESSMENT AND PLAN: 1. Palpitations The patient has a long history of palpitations dating back to 2005, at which time she underwent evaluation in [place]. She describes more severe episodes over the last 6 months. Interestingly, she  did not have any symptoms while abstinent from caffeine. Her exam today is unremarkable. EKG shows sinus arrhythmia with nonspecific T-wave changes as well as a U wave, which may reflect an electrolyte abnormality. We have agreed to check a CBC, BMP, magnesium, and TSH today. We will also place a 14 day event monitor to further characterize any potential arrhythmias. She can continue to use as needed atenolol or propranolol.  2. Chest pain Skin present for several months and seems to be musculoskeletal or related to fibromyalgia based on the patient's description. Given her age and lack of cardiovascular risk factors, as well as exertional resolution of the pain, I have a very low suspicion for CAD. I have asked her to cut out all caffeine, as she previously noted resolution of the pain with this. If it continues, we may consider an echocardiogram to exclude pericarditis, though the location, quality, and long duration of the pain are unusual for this. Her EKG is also without findings suggestive of pericarditis.  Follow-up: Return to clinic in 2 months.  [name], MD 11/13/2015 1:48 PM

## 2015-11-13 NOTE — Patient Instructions (Signed)
Medication Instructions:  Your physician recommends that you continue on your current medications as directed. Please refer to the Current Medication list given to you today.   Labwork: Bmet, Mag, Tsh, Cbc  Testing/Procedures: Your physician has recommended that you wear an event monitor. Event monitors are medical devices that record the heart's electrical activity. Doctors most often [place] these monitors to diagnose arrhythmias. Arrhythmias are problems with the speed or rhythm of the heartbeat. The monitor is a small, portable device. You can wear one while you do your normal daily activities. This is usually used to diagnose what is causing palpitations/syncope (passing out).   Follow-Up: Your physician recommends that you schedule a follow-up appointment in: 2 months    Any Other Special Instructions Will Be Listed Below (If Applicable).     If you need a refill on your cardiac medications before your next appointment, please call your pharmacy.

## 2015-11-18 ENCOUNTER — Encounter: Payer: Self-pay | Admitting: Internal Medicine

## 2015-11-20 ENCOUNTER — Encounter: Payer: Self-pay | Admitting: *Deleted

## 2015-11-20 ENCOUNTER — Ambulatory Visit (INDEPENDENT_AMBULATORY_CARE_PROVIDER_SITE_OTHER): Payer: No Typology Code available for payment source

## 2015-11-20 DIAGNOSIS — R002 Palpitations: Secondary | ICD-10-CM

## 2015-12-11 ENCOUNTER — Telehealth: Payer: Self-pay | Admitting: Internal Medicine

## 2015-12-11 NOTE — Telephone Encounter (Signed)
Attempted to contact patient using [place] [phone]) Christine Rosales (ID# [phone]) to discuss results of event monitor.  Patient was not available; left message that we would try to reach her again on Monday.  [name], MD [facility] Pager: [phone]

## 2015-12-14 NOTE — Telephone Encounter (Signed)
Spanish interpreter Elita Quick ID # [phone]  Reached voicemail-left message to call Dr End at [facility] [phone].

## 2015-12-14 NOTE — Telephone Encounter (Addendum)
Spanish interpreter Administrator, arts): Cory Roughen ID# [phone]  I attempted to reach the patient to discuss the results of her monitor but only reached her voicemail.  I left a message asking her to call [place] at [facility]  I would like for her to have an echocardiogram before our follow-up on 01/01/16.  We will discuss the results of her monitor and echo at that time, as well as consider changes to her medications based on her symptoms.  [name], MD [facility] Pager: [phone]

## 2015-12-15 ENCOUNTER — Telehealth: Payer: Self-pay | Admitting: Internal Medicine

## 2015-12-15 NOTE — Telephone Encounter (Signed)
Patient was returning phone call please call her back  At [phone]

## 2015-12-15 NOTE — Telephone Encounter (Signed)
I attempted to return phone call to [phone], I received a recorded message that the person I am trying to call is not receiving messages at this time.

## 2015-12-15 NOTE — Telephone Encounter (Signed)
Spanish Interpreter Christy Sartorius ID # [id number], LMTCB for pt.

## 2015-12-16 NOTE — Telephone Encounter (Signed)
Pt is returning phone call ° °

## 2015-12-17 NOTE — Telephone Encounter (Signed)
I was not in office 12/16/15  with Out of Office message activated.   12/17/15:  Spanish Interpreter Judee Clara [phone]  I have been unable to reach pt at either [phone], LMTCB,  or [phone], not able to leave a message.

## 2015-12-23 ENCOUNTER — Encounter: Payer: Self-pay | Admitting: *Deleted

## 2015-12-23 NOTE — Telephone Encounter (Signed)
I mailed letter to pt asking her to contact office to discuss monitor results and confirm/ provide accurate contact information

## 2016-01-01 ENCOUNTER — Encounter: Payer: Self-pay | Admitting: Internal Medicine

## 2016-01-01 ENCOUNTER — Ambulatory Visit (INDEPENDENT_AMBULATORY_CARE_PROVIDER_SITE_OTHER): Payer: No Typology Code available for payment source | Admitting: Internal Medicine

## 2016-01-01 VITALS — BP 120/70 | HR 62 | Ht 64.0 in | Wt 128.6 lb

## 2016-01-01 DIAGNOSIS — R002 Palpitations: Secondary | ICD-10-CM

## 2016-01-01 MED ORDER — DILTIAZEM HCL 30 MG PO TABS: ORAL_TABLET | ORAL | 2 refills | Status: DC

## 2016-01-01 NOTE — Progress Notes (Signed)
Follow-up Outpatient Visit Date: 01/01/2016  Chief Complaint: Follow-up palpitations  HPI:  [name] is a 40 y.o. year-old female with history of long-standing palpitations, anxiety, and fibromyalgia, who presents for follow-up of palpitations. I first met her on 11/13/15, at which time she described 3 preceding episodes of severe palpitations that she describes as "tachycardia" over the last 6 months. She has had these off and on for more than a decade and intermittently takes propranolol or atenolol for relief of symptoms. However, these medications also cause her to have significant fatigue for several hours. We agreed to perform a 14 day event monitor for further characterization, which revealed predominately normal sinus rhythm with frequent sinus tachycardia. No significant ectopy or sustained arrhythmias were identified.  Since having worn the monitor, the patient has continued to have intermittent episodes of palpitations occurring about once per week. One episode caused her to summon EMS. They advised her to take a dose of propranolol, which cause the symptoms to abate. She did not need to be transported to the emergency department. She has not had any episodes of chest pain or shortness of breath. She continues to consume caffeine from time to time and notes that her symptoms seem to be related to her caffeine intake. She denies edema, orthopnea, PND, and lightheadedness.  --------------------------------------------------------------------------------------------------  Cardiovascular History & Procedures: Cardiovascular Problems:  Palpitations  Risk Factors:  None  Cath/PCI:  None  CV Surgery:  None  EP Procedures and Devices:  14-day event monitor (11/20/15): Predominantly sinus rhythm with sinus tachycardia (17% of monitoring period). No significant supraventricular or ventricular ectopy. No sustained arrhythmias or prolonged pauses.  Non-Invasive  Evaluation(s):  None  Recent CV Pertinent Labs: Lab Results  Component Value Date   K 4.0 11/13/2015   MG 1.9 11/13/2015   BUN 13 11/13/2015   CREATININE 0.79 11/13/2015     Past medical and surgical history were reviewed and updated in EPIC.   Outpatient Encounter Prescriptions as of 01/01/2016  Medication Sig  . atenolol (TENORMIN) 25 MG tablet Take 25 mg by mouth as needed.   . baclofen (LIORESAL) 10 MG tablet Take 10 mg by mouth 2 (two) times daily. Reported on 06/09/2015  . benzonatate (TESSALON) 100 MG capsule Take 1 capsule (100 mg total) by mouth 3 (three) times daily as needed.  . cyclobenzaprine (FLEXERIL) 10 MG tablet Take 1 tablet (10 mg total) by mouth at bedtime.  . diclofenac (VOLTAREN) 75 MG EC tablet Take 1 tablet (75 mg total) by mouth 2 (two) times daily.  . fluticasone (FLONASE) 50 MCG/ACT nasal spray Place 2 sprays into both nostrils daily.  . hydrOXYzine (ATARAX/VISTARIL) 25 MG tablet 1 tab po as needed severe anxiety or panic attack.(max total in any one day 3 tab and 8 hours apart)  . levocetirizine (XYZAL) 5 MG tablet Take 1 tablet (5 mg total) by mouth every evening.  Marland Kitchen omeprazole (PRILOSEC) 20 MG capsule Take 1 capsule (20 mg total) by mouth 2 (two) times daily before a meal.  . propranolol (INDERAL) 20 MG tablet Take 20 mg by mouth daily as needed (rapid heartrate).    No facility-administered encounter medications on file as of 01/01/2016.     Allergies: Codeine; Sudafed [pseudoephedrine]; and Sulfa antibiotics  Social History   Social History  . Marital status: Married    Spouse name: N/A  . Number of children: N/A  . Years of education: N/A   Occupational History  . Not on file.  Social History Main Topics  . Smoking status: Never Smoker  . Smokeless tobacco: Never Used  . Alcohol use No  . Drug use: No  . Sexual activity: Yes    Birth control/ protection: Surgical, Other-see comments     Comment: husband has vascetomy   Other Topics  Concern  . Not on file   Social History Narrative   Born and raised in [place] by parents. Pt has one older sister. Mom suffered from depression and it affected how she raised the pt. Pt has a college degree in Associate Emergency Medicine. Pt moved to [place] in 2009 due to her husband's job. Pt has bee married 14 yrs and has 2 kids. 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     Family History  Problem Relation Age of Onset  . Depression Mother   . Stroke Mother   . Hypertension Mother   . Depression Sister   . Heart murmur Sister   . Hypertension Father     Review of Systems: A 12-system review of systems was performed and was negative except as noted in the HPI.  --------------------------------------------------------------------------------------------------  Physical Exam: BP 120/70   Pulse 62   Ht 5' 4"  (1.626 m)   Wt 128 lb 9.6 oz (58.3 kg)   BMI 22.07 kg/m   General:  Well-developed, well-nourished woman seated comfortably in the exam room. She is accompanied by her husband. HEENT: No conjunctival pallor or scleral icterus.  Moist mucous membranes.  OP clear. Neck: Supple without lymphadenopathy, thyromegaly, JVD, or HJR. Lungs: Normal work of breathing.  Clear to auscultation bilaterally without wheezes or crackles. Heart: Regular rate and rhythm without murmurs, rubs, or gallops.  Non-displaced PMI. Abd: Bowel sounds present.  Soft, NT/ND without hepatosplenomegaly Ext: No lower extremity edema.  Radial, PT, and DP pulses are 2+ bilaterally. Skin: warm and dry without rash  Lab Results  Component Value Date   WBC 7.7 11/13/2015   HGB 11.7 11/13/2015   HCT 35.4 11/13/2015   MCV 77.8 (L) 11/13/2015   PLT 377 11/13/2015    Lab Results  Component Value Date   NA 140 11/13/2015   K 4.0 11/13/2015   CL 102 11/13/2015   CO2 27 11/13/2015   BUN 13 11/13/2015   CREATININE 0.79 11/13/2015   GLUCOSE 79 11/13/2015   ALT 7  02/27/2015   Lab Results  Component Value Date   TSH 0.79 11/13/2015   --------------------------------------------------------------------------------------------------  ASSESSMENT AND PLAN: Palpitations: Monitor was notable only for episodes of sinus tachycardia. Given that her symptoms frequently occur during stress, it is possible that she may be experiencing episodes of anxiety eating to sinus tachycardia. It is quite likely that caffeine may be contributing to this, as she has notable improvement in her symptoms when avoiding caffeine. I spoke to her at length again about avoiding caffeine. Since she has considerable fatigue with beta blockers, I have provided her with a prescription for diltiazem 30 mg by mouth every 6 hours as needed for palpitations. She does not wish to take any maintenance therapy. The patient reports that she is not pregnant and does not intend to become pregnant. We discussed the potential for complications with diltiazem should she become pregnant. Given her tachycardia, we have also agreed to obtain a transthoracic echocardiogram to evaluate for structural abnormalities that may be contributing to this. Given difficulties reaching her by phone after her last visit, she has asked that we call  her husband to discuss the results of the echo.  Follow-up: Return to clinic in 3 months.  [name], MD 01/01/2016 9:06 AM

## 2016-01-01 NOTE — Patient Instructions (Signed)
Medication Instructions:  Stop propranolol.  Stop atenolol.  Use diltiazem (cardizem) 30mg  every 6 hours as needed for palpitations.  Labwork: none  Testing/Procedures: Your physician has requested that you have an echocardiogram. Echocardiography is a painless test that uses sound waves to create images of your heart. It provides your doctor with information about the size and shape of your heart and how well your heart's chambers and valves are working. This procedure takes approximately one hour. There are no restrictions for this procedure.    Follow-Up: Your physician recommends that you schedule a follow-up appointment in: 3 months with Dr End.   Any Other Special Instructions Will Be Listed Below (If Applicable).  AVOID CAFFEINE   If you need a refill on your cardiac medications before your next appointment, please call your pharmacy.

## 2016-01-22 ENCOUNTER — Other Ambulatory Visit: Payer: Self-pay

## 2016-01-22 ENCOUNTER — Ambulatory Visit (HOSPITAL_COMMUNITY): Payer: No Typology Code available for payment source | Attending: Cardiology

## 2016-01-22 DIAGNOSIS — R002 Palpitations: Secondary | ICD-10-CM | POA: Diagnosis not present

## 2016-01-22 DIAGNOSIS — I081 Rheumatic disorders of both mitral and tricuspid valves: Secondary | ICD-10-CM | POA: Diagnosis not present

## 2016-03-10 ENCOUNTER — Encounter: Payer: Self-pay | Admitting: Medical

## 2016-03-10 ENCOUNTER — Telehealth: Payer: Self-pay | Admitting: Medical

## 2016-03-10 ENCOUNTER — Ambulatory Visit (INDEPENDENT_AMBULATORY_CARE_PROVIDER_SITE_OTHER): Payer: No Typology Code available for payment source | Admitting: Medical

## 2016-03-10 VITALS — BP 122/86 | HR 83 | Temp 98.2°F | Ht 64.0 in | Wt 126.0 lb

## 2016-03-10 DIAGNOSIS — Z87440 Personal history of urinary (tract) infections: Secondary | ICD-10-CM | POA: Diagnosis not present

## 2016-03-10 DIAGNOSIS — R5383 Other fatigue: Secondary | ICD-10-CM | POA: Diagnosis not present

## 2016-03-10 DIAGNOSIS — M2550 Pain in unspecified joint: Secondary | ICD-10-CM | POA: Diagnosis not present

## 2016-03-10 DIAGNOSIS — M791 Myalgia, unspecified site: Secondary | ICD-10-CM

## 2016-03-10 MED ORDER — MELOXICAM 7.5 MG PO TABS: ORAL_TABLET | ORAL | 0 refills | Status: DC

## 2016-03-10 MED ORDER — KETOROLAC TROMETHAMINE 30 MG/ML IJ SOLN: 30.0000 mg | Freq: Once | INTRAMUSCULAR | 0 refills | Status: DC

## 2016-03-10 MED ORDER — KETOROLAC TROMETHAMINE 30 MG/ML IJ SOLN
30.0000 mg | Freq: Once | INTRAMUSCULAR | Status: AC
  Administered 2016-03-10: 30 mg via INTRAVENOUS

## 2016-03-10 NOTE — Progress Notes (Signed)
Subjective:    Patient ID: Christine Rosales, female    DOB: 04/02/1975, 41 y.o.   MRN: [id number]  HPI  Pt in with 2 weeks of some pain in her body and joints. Pt went to [facility] at [facility]. They gave her tramadol. Pt felt dizzy and sob with tramadol. She felt drugged out. Pt in past used toradol in past for some upper trapezius pain. Helped in past with no side effects. Pain still present shoulder, upper back, arms and over ribs. Mild leg pain.  Since January has been having on and off pain. But past 2 weeks constant. Pt in past had some occasional body pain. She speculates she may have fibromyalgia.  Pt flu test 2 weeks ago came back negative.   Pt states 2 weeks go urgent care told her she had a uti. She was given augmentin. Pt at that time denies uti type signs and symptoms.  LMP- Feb 04, 2016. 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 Husband had vasecotmy.   Review of Systems  Constitutional: Positive for fatigue. Negative for chills and fever.  HENT: Negative for congestion, ear pain, hearing loss, mouth sores, postnasal drip, sinus pain and sinus pressure.   Respiratory: Negative for cough, chest tightness, shortness of breath and wheezing.   Cardiovascular: Negative for chest pain and palpitations.  Gastrointestinal: Negative for abdominal pain, constipation, diarrhea, nausea and vomiting.  Musculoskeletal: Positive for back pain.       Shoulder, elbow pain. upper back and some thigh pain.  Skin: Negative for rash.  Neurological: Negative for dizziness, weakness, light-headedness and numbness.  Hematological: Negative for adenopathy. Does not bruise/bleed easily.  Psychiatric/Behavioral: Negative for behavioral problems and confusion.    Past Medical History:  Diagnosis Date  . Anxiety   . Fibromyalgia   . Headache   . Palpitations   . TMJ (dislocation of temporomandibular joint)      Social History   Social History  . Marital status: Married      Spouse name: N/A  . Number of children: N/A  . Years of education: N/A   Occupational History  . Not on file.   Social History Main Topics  . Smoking status: Never Smoker  . Smokeless tobacco: Never Used  . Alcohol use No  . Drug use: No  . Sexual activity: Yes    Birth control/ protection: Surgical, Other-see comments     Comment: husband has vascetomy   Other Topics Concern  . Not on file   Social History Narrative   Born and raised in [place] by parents. Pt has one older sister. Mom suffered from depression and it affected how she raised the pt. Pt has a college degree in Associate Emergency Medicine. Pt moved to [place] in 2009 due to her husband's job. Pt has bee married 14 yrs and has 2 kids. 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     Past Surgical History:  Procedure Laterality Date  . NO PAST SURGERIES      Family History  Problem Relation Age of Onset  . Depression Mother   . Stroke Mother   . Hypertension Mother   . Depression Sister   . Heart murmur Sister   . Hypertension Father     Allergies  Allergen Reactions  . Codeine Rash  . Sudafed [Pseudoephedrine] Rash  . Sulfa Antibiotics     Rapid heartrate    Current  Outpatient Prescriptions on File Prior to Visit  Medication Sig Dispense Refill  . baclofen (LIORESAL) 10 MG tablet Take 10 mg by mouth 2 (two) times daily. Reported on 06/09/2015    . cyclobenzaprine (FLEXERIL) 10 MG tablet Take 1 tablet (10 mg total) by mouth at bedtime. 30 tablet 0  . hydrOXYzine (ATARAX/VISTARIL) 25 MG tablet 1 tab po as needed severe anxiety or panic attack.(max total in any one day 3 tab and 8 hours apart) 15 tablet 0  . omeprazole (PRILOSEC) 20 MG capsule Take 1 capsule (20 mg total) by mouth 2 (two) times daily before a meal. 60 capsule 3  . benzonatate (TESSALON) 100 MG capsule Take 1 capsule (100 mg total) by mouth 3 (three) times daily as needed. (Patient not taking: Reported on  03/10/2016) 21 capsule 0  . diclofenac (VOLTAREN) 75 MG EC tablet Take 1 tablet (75 mg total) by mouth 2 (two) times daily. (Patient not taking: Reported on 03/10/2016) 30 tablet 1  . diltiazem (CARDIZEM) 30 MG tablet 1 tablet by mouth every 6 hours as needed for palpitations (Patient not taking: Reported on 03/10/2016) 30 tablet 2  . fluticasone (FLONASE) 50 MCG/ACT nasal spray Place 2 sprays into both nostrils daily. (Patient not taking: Reported on 03/10/2016) 16 g 1  . levocetirizine (XYZAL) 5 MG tablet Take 1 tablet (5 mg total) by mouth every evening. (Patient not taking: Reported on 03/10/2016) 30 tablet 0   No current facility-administered medications on file prior to visit.     BP 122/86   Pulse 83   Temp 98.2 F (36.8 C) (Oral)   Ht 5\' 4"  (1.626 m)   Wt 126 lb (57.2 kg)   LMP 02/04/2016   SpO2 100%   BMI 21.63 kg/m       Objective:   Physical Exam  General Mental Status- Alert. General Appearance- Not in acute distress.   Skin General: Color- Normal Color. Moisture- Normal Moisture.  Neck Carotid Arteries- Normal color. Moisture- Normal Moisture. No carotid bruits. No JVD.  Chest and Lung Exam Auscultation: Breath Sounds:-Normal.  Cardiovascular Auscultation:Rythm- Regular. Murmurs & Other Heart Sounds:Auscultation of the heart reveals- No Murmurs.  Abdomen Inspection:-Inspeection Normal. Palpation/Percussion:Note:No mass. Palpation and Percussion of the abdomen reveal- Non Tender, Non Distended + BS, no rebound or guarding.    Neurologic Cranial Nerve exam:- CN III-XII intact(No nystagmus), symmetric smile. Strength:- 5/5 equal and symmetric strength both upper and lower extremities.  Musculoskeletal-  shoulders, biceps, upper thoracic spine and thighs tender to palpation. Symmetric pain bilaterally. Some bilateral elbow pain faint as well.      Assessment & Plan:  For your diffuse joint and muscle aches for 2 month we gave toradol today and can start  meloxicam tomorrow. Please get inflammation studies/labs today. If negative and pain continues considering trial medication for fibromyalgia.  In lab will give urine and we will culture.  For fatigue will getting b1-b12 and vit d level. In October cbc, cmp and tsh normal so not repeating today.  Also urine culture in the lab  Follow up date to be determined after lab review.  Nesha Counihan, Percell Miller, PA-C

## 2016-03-10 NOTE — Telephone Encounter (Signed)
Pt had urine poct ordered. Was it done. If so please result the study.

## 2016-03-10 NOTE — Progress Notes (Signed)
Pre visit review using our clinic tool,if applicable. No additional management support is needed unless otherwise documented below in the visit note.  

## 2016-03-10 NOTE — Patient Instructions (Addendum)
For your diffuse joint and muscle aches for 2 month we gave toradol today and can start meloxicam tomorrow. Please get inflammation studies/labs today. If negative and pain continues considering trial medication for fibromyalgia.  In lab will give urine and we will culture.  For fatigue will getting b1-b12 and vit d level. In October cbc, cmp and tsh normal so not repeating today.    Also urine culture in the lab  Follow up date to be determined after lab review.

## 2016-03-11 LAB — C-REACTIVE PROTEIN: CRP: 0.1 mg/dL — ABNORMAL LOW (ref 0.5–20.0)

## 2016-03-11 LAB — URINE CULTURE: Organism ID, Bacteria: NO GROWTH

## 2016-03-11 LAB — SEDIMENTATION RATE: Sed Rate: 3 mm/hr (ref 0–20)

## 2016-03-11 LAB — VITAMIN B12: VITAMIN B 12: 270 pg/mL (ref 211–911)

## 2016-03-11 LAB — RHEUMATOID FACTOR: RHEUMATOID FACTOR: 20 [IU]/mL — AB (ref <14)

## 2016-03-12 LAB — ANA: Anti Nuclear Antibody(ANA): NEGATIVE

## 2016-03-14 ENCOUNTER — Telehealth: Payer: Self-pay | Admitting: Medical

## 2016-03-14 DIAGNOSIS — M2550 Pain in unspecified joint: Secondary | ICD-10-CM

## 2016-03-14 DIAGNOSIS — R768 Other specified abnormal immunological findings in serum: Secondary | ICD-10-CM

## 2016-03-14 LAB — VITAMIN D 1,25 DIHYDROXY
VITAMIN D3 1, 25 (OH): 38 pg/mL
Vitamin D 1, 25 (OH)2 Total: 38 pg/mL (ref 18–72)

## 2016-03-14 LAB — VITAMIN B1: Vitamin B1 (Thiamine): 17 nmol/L (ref 8–30)

## 2016-03-15 ENCOUNTER — Telehealth: Payer: Self-pay | Admitting: Medical

## 2016-03-15 NOTE — Telephone Encounter (Signed)
-----   Message from Mackie Pai, PA-C sent at 03/14/2016  9:56 PM EST ----- Pt vitamin d is normal.  b1 and b12 normal. Inflammatory test normal but elevated rheumatoid factor. I do thinks good idea to refer to rheumatologist in light of her joint and muscle pains. Will put in referral and advise to call us in one to 2 weeks if no one calls on referral. You could check with jennifer if patient calls for referral update.

## 2016-03-15 NOTE — Telephone Encounter (Signed)
LVM for pt to return call

## 2016-03-15 NOTE — Telephone Encounter (Signed)
Sent referral to [facility], awaiting appt

## 2016-03-22 LAB — POC URINALSYSI DIPSTICK (AUTOMATED)
BILIRUBIN UA: NEGATIVE
Glucose, UA: NEGATIVE
KETONES UA: NEGATIVE
Leukocytes, UA: NEGATIVE
NITRITE UA: NEGATIVE
PH UA: 6
Protein, UA: NEGATIVE
RBC UA: NEGATIVE
SPEC GRAV UA: 1.025
Urobilinogen, UA: NEGATIVE

## 2016-03-22 NOTE — Telephone Encounter (Signed)
Order was done , the POCT order that was entered previously was in telephone note which doesn't transfer over, order was placed correctly and done.

## 2016-03-22 NOTE — Addendum Note (Signed)
Addended by: Peggyann Shoals on: 03/22/2016 11:27 AM   Modules accepted: Orders

## 2016-03-29 ENCOUNTER — Telehealth: Payer: Self-pay | Admitting: Medical

## 2016-03-29 MED ORDER — BACLOFEN 10 MG PO TABS: 10.0000 mg | ORAL_TABLET | Freq: Two times a day (BID) | ORAL | Status: DC

## 2016-03-29 NOTE — Telephone Encounter (Signed)
I refilled her baclofen rx. Notify pt.

## 2016-03-29 NOTE — Telephone Encounter (Signed)
Caller name: Evangeline Amonett  Relation to pt: self  Call back number: [phone] Pharmacy: [facility], [place]  Reason for call: Pt is needing a refill for rx baclofen (LIORESAL) 10 MG tablet. Pt states does not have med left and is needing refill ASAP. Pt states her neurologist  Dr. Coralee North was the one that gave her the rx the last time but pt states the doctor moved out and has not been able to get rx. Please advise.

## 2016-03-30 NOTE — Telephone Encounter (Signed)
Pt was notified her rx was sent to pharmacy.

## 2016-04-13 ENCOUNTER — Encounter: Payer: Self-pay | Admitting: Internal Medicine

## 2016-04-22 ENCOUNTER — Encounter: Payer: Self-pay | Admitting: Internal Medicine

## 2016-04-22 ENCOUNTER — Ambulatory Visit (INDEPENDENT_AMBULATORY_CARE_PROVIDER_SITE_OTHER): Payer: No Typology Code available for payment source | Admitting: Internal Medicine

## 2016-04-22 VITALS — BP 124/74 | HR 69 | Ht 64.0 in | Wt 129.4 lb

## 2016-04-22 DIAGNOSIS — R0789 Other chest pain: Secondary | ICD-10-CM | POA: Diagnosis not present

## 2016-04-22 DIAGNOSIS — R002 Palpitations: Secondary | ICD-10-CM

## 2016-04-22 NOTE — Progress Notes (Signed)
Follow-up Outpatient Visit Date: 04/22/2016  Chief Complaint: Follow-up palpitations  HPI:  Christine Rosales is a 41 y.o. year-old female with history of long-standing palpitations, anxiety, and fibromyalgia, who presents for follow-up of palpitations. History is obtained with the assistance of Spanish interpreter via video feed. I last saw Christine Rosales on 01/01/16, which time we switched her from atenolol to as needed diltiazem. We also obtained a transthoracic echocardiogram, which did not show any significant abnormalities. Today, the patient reports that she has been feeling better than at her than at her last visit. However, she continues to have episodes of "tachycardia" with heart rates up to 110 bpm. She also feels like her blood pressure is high at that time. She has taken diltiazem 4 times in the last week with improvement in her symptoms but resultant headaches. She noted headaches in the past with atenolol as well, albeit less severe. She continues to have sporadic stabbing pains in her chest that are intermittently associated with her "tachycardia." Pain is nonexertional and has been present for many years. The patient has cut out coffee but continues to drink one caffeinated soda per day. Overall, she feels better with de-escalation of caffeine consumption. She wonders whether her symptoms would be better helped by an anxiolytic.  --------------------------------------------------------------------------------------------------  Cardiovascular History & Procedures: Cardiovascular Problems:  Palpitations  Risk Factors:  None  Cath/PCI:  None  CV Surgery:  None  EP Procedures and Devices:  14-day event monitor (11/20/15): Predominantly sinus rhythm with sinus tachycardia (17% of monitoring period). No significant supraventricular or ventricular ectopy. No sustained arrhythmias or prolonged pauses.  Non-Invasive Evaluation(s):  TTE (01/22/16): Normal LV size with  LVEF is 60-65% with normal wall motion and diastolic function. Normal RV size and function. No significant valvular abnormalities. Normal pulmonary artery and central venous pressure.  Recent CV Pertinent Labs: Lab Results  Component Value Date   K 4.0 11/13/2015   MG 1.9 11/13/2015   BUN 13 11/13/2015   CREATININE 0.79 11/13/2015    Past medical and surgical history were reviewed and updated in EPIC.   Outpatient Encounter Prescriptions as of 04/22/2016  Medication Sig  . baclofen (LIORESAL) 10 MG tablet Take 1 tablet (10 mg total) by mouth 2 (two) times daily. Reported on 06/09/2015  . cyclobenzaprine (FLEXERIL) 10 MG tablet Take 1 tablet (10 mg total) by mouth at bedtime.  Marland Kitchen FLUoxetine (PROZAC) 10 MG tablet Take 10 mg by mouth daily.  . hydrOXYzine (ATARAX/VISTARIL) 25 MG tablet 1 tab po as needed severe anxiety or panic attack.(max total in any one day 3 tab and 8 hours apart)  . meloxicam (MOBIC) 7.5 MG tablet 1-2 tab po q day  . omeprazole (PRILOSEC) 20 MG capsule Take 1 capsule (20 mg total) by mouth 2 (two) times daily before a meal.  . [DISCONTINUED] benzonatate (TESSALON) 100 MG capsule Take 1 capsule (100 mg total) by mouth 3 (three) times daily as needed. (Patient not taking: Reported on 03/10/2016)  . [DISCONTINUED] diclofenac (VOLTAREN) 75 MG EC tablet Take 1 tablet (75 mg total) by mouth 2 (two) times daily. (Patient not taking: Reported on 03/10/2016)  . [DISCONTINUED] diltiazem (CARDIZEM) 30 MG tablet 1 tablet by mouth every 6 hours as needed for palpitations (Patient not taking: Reported on 03/10/2016)  . [DISCONTINUED] fluticasone (FLONASE) 50 MCG/ACT nasal spray Place 2 sprays into both nostrils daily. (Patient not taking: Reported on 03/10/2016)  . [DISCONTINUED] levocetirizine (XYZAL) 5 MG tablet Take 1 tablet (5 mg total)  by mouth every evening. (Patient not taking: Reported on 03/10/2016)   No facility-administered encounter medications on file as of 04/22/2016.      Allergies: Codeine; Sudafed [pseudoephedrine]; and Sulfa antibiotics  Social History   Social History  . Marital status: Married    Spouse name: N/A  . Number of children: N/A  . Years of education: N/A   Occupational History  . Not on file.   Social History Main Topics  . Smoking status: Never Smoker  . Smokeless tobacco: Never Used  . Alcohol use No  . Drug use: No  . Sexual activity: Yes    Birth control/ protection: Surgical, Other-see comments     Comment: husband has vascetomy   Other Topics Concern  . Not on file   Social History Narrative   Born and raised in [place] by parents. Pt has one older sister. Mom suffered from depression and it affected how she raised the pt. Pt has a college degree in Associate Emergency Medicine. Pt moved to [place] in 2009 due to her husband's job. Pt has bee married 14 yrs and has 2 kids. 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     Family History  Problem Relation Age of Onset  . Depression Mother   . Stroke Mother   . Hypertension Mother   . Depression Sister   . Heart murmur Sister   . Hypertension Father     Review of Systems: A 12-system review of systems was performed and was negative except as noted in the HPI.  --------------------------------------------------------------------------------------------------  Physical Exam: BP 124/74   Pulse 80   Ht 5\' 4"  (1.626 m)   Wt 129 lb 6 oz (58.7 kg)   SpO2 96%   BMI 22.21 kg/m   General:  Well-developed, well-nourished woman seated comfortably in the exam room. HEENT: No conjunctival pallor or scleral icterus.  Moist mucous membranes.  OP clear. Neck: Supple without lymphadenopathy, thyromegaly, JVD, or HJR. Lungs: Normal work of breathing.  Clear to auscultation bilaterally without wheezes or crackles. Heart: Regular rate and rhythm without murmurs, rubs, or gallops. Abd: Bowel sounds present.  Soft, NT/ND without  hepatosplenomegaly Ext: No lower extremity edema.  Radial, PT, and DP pulses are 2+ bilaterally. Skin: warm and dry without rash  Lab Results  Component Value Date   WBC 7.7 11/13/2015   HGB 11.7 11/13/2015   HCT 35.4 11/13/2015   MCV 77.8 (L) 11/13/2015   PLT 377 11/13/2015    Lab Results  Component Value Date   NA 140 11/13/2015   K 4.0 11/13/2015   CL 102 11/13/2015   CO2 27 11/13/2015   BUN 13 11/13/2015   CREATININE 0.79 11/13/2015   GLUCOSE 79 11/13/2015   ALT 7 02/27/2015   Lab Results  Component Value Date   TSH 0.79 11/13/2015   --------------------------------------------------------------------------------------------------  ASSESSMENT AND PLAN: Palpitation: Symptoms have improved with de-escalation of caffeine consumption as well as prn diltiazem. However, she continues to have a constellation of symptoms including palpitations, headache, and elevated blood pressure (though she does not report specific blood pressure readings). I suspect that her symptoms may be related to anxiety. However, we have agreed to perform a 24-hour urine collection for fractionated catecholamines and metanephrines to exclude pheochromocytoma. We will continue with as needed diltiazem; we could consider transitioning to standing long-acting diltiazem in the future. Patient would also benefit from speaking with her PCP for anxiety treatment.  Non-cardiac chest  pain: Stabbing chest pain persists and is nonexertional. Suspected is unlikely due to obstructive coronary artery disease. We will continue to monitor.  Follow-up: Return to clinic in 2 months.  [name], MD 04/22/2016 10:36 AM

## 2016-04-22 NOTE — Patient Instructions (Addendum)
Medication Instructions:  Your physician recommends that you continue on your current medications as directed. Please refer to the Current Medication list given to you today.   Labwork: 24 hour urine for catecholamines, fractionated, and metanephrine  Testing/Procedures: None   Follow-Up: Your physician recommends that you schedule a follow-up appointment in: 2 months with Dr End.   Any Other Special Instructions Will Be Listed Below (If Applicable).     If you need a refill on your cardiac medications before your next appointment, please call your pharmacy.

## 2016-04-25 ENCOUNTER — Other Ambulatory Visit: Payer: Self-pay | Admitting: Internal Medicine

## 2016-04-27 LAB — METANEPHRINES, URINE, 24 HOUR
Metaneph Total, Ur: 88 ug/L
Metanephrines, 24H Ur: 87 ug/24 hr (ref 45–290)
Normetanephrine, 24H Ur: 431 ug/24 hr (ref 82–500)
Normetanephrine, Ur: 435 ug/L

## 2016-04-29 ENCOUNTER — Other Ambulatory Visit: Payer: Self-pay | Admitting: Internal Medicine

## 2016-04-29 ENCOUNTER — Other Ambulatory Visit: Payer: No Typology Code available for payment source

## 2016-05-03 ENCOUNTER — Telehealth: Payer: Self-pay | Admitting: Internal Medicine

## 2016-05-03 NOTE — Telephone Encounter (Signed)
Pt and husband are aware of 24 hrs urine  Collection results were  normal and for her to continue taking the Diltiazem as needed for palpitation. Pt to F/U with PCP for anxiety.pt and husband verbalized understanding.

## 2016-05-03 NOTE — Telephone Encounter (Signed)
New message   Pt husband is returning call to nurse about results.

## 2016-05-05 LAB — CATECHOLAMINES, FRACTIONATED, URINE, 24 HOUR
DOPAMINE RANDOM UR: 236 ug/L
Dopamine , 24H Ur: 260 ug/24 hr (ref 0–510)
EPINEPHRINE RAND UR: 3 ug/L
Epinephrine, 24H Ur: 3 ug/24 hr (ref 0–20)
NOREPINEPH RAND UR: 41 ug/L
NOREPINEPHRINE 24H UR: 45 ug/(24.h) (ref 0–135)

## 2016-05-05 LAB — PLEASE NOTE

## 2016-06-05 ENCOUNTER — Emergency Department (HOSPITAL_BASED_OUTPATIENT_CLINIC_OR_DEPARTMENT_OTHER)
Admission: EM | Admit: 2016-06-05 | Discharge: 2016-06-06 | Disposition: A | Discharge status: Home / Self Care | Payer: No Typology Code available for payment source | Attending: Emergency Medicine | Admitting: Emergency Medicine

## 2016-06-05 ENCOUNTER — Encounter (HOSPITAL_BASED_OUTPATIENT_CLINIC_OR_DEPARTMENT_OTHER): Payer: Self-pay | Admitting: *Deleted

## 2016-06-05 ENCOUNTER — Emergency Department (HOSPITAL_BASED_OUTPATIENT_CLINIC_OR_DEPARTMENT_OTHER): Payer: No Typology Code available for payment source

## 2016-06-05 DIAGNOSIS — R072 Precordial pain: Secondary | ICD-10-CM | POA: Insufficient documentation

## 2016-06-05 DIAGNOSIS — R0602 Shortness of breath: Secondary | ICD-10-CM | POA: Diagnosis not present

## 2016-06-05 DIAGNOSIS — R251 Tremor, unspecified: Secondary | ICD-10-CM | POA: Diagnosis not present

## 2016-06-05 DIAGNOSIS — R51 Headache: Secondary | ICD-10-CM | POA: Insufficient documentation

## 2016-06-05 LAB — CBC
HEMATOCRIT: 30.6 % — AB (ref 36.0–46.0)
Hemoglobin: 9.9 g/dL — ABNORMAL LOW (ref 12.0–15.0)
MCH: 25.4 pg — ABNORMAL LOW (ref 26.0–34.0)
MCHC: 32.4 g/dL (ref 30.0–36.0)
MCV: 78.7 fL (ref 78.0–100.0)
PLATELETS: 411 10*3/uL — AB (ref 150–400)
RBC: 3.89 MIL/uL (ref 3.87–5.11)
RDW: 14.5 % (ref 11.5–15.5)
WBC: 8.7 10*3/uL (ref 4.0–10.5)

## 2016-06-05 LAB — BASIC METABOLIC PANEL
Anion gap: 8 (ref 5–15)
BUN: 15 mg/dL (ref 6–20)
CHLORIDE: 104 mmol/L (ref 101–111)
CO2: 25 mmol/L (ref 22–32)
CREATININE: 0.7 mg/dL (ref 0.44–1.00)
Calcium: 9 mg/dL (ref 8.9–10.3)
GFR calc Af Amer: >60 mL/min (ref >60)
GFR calc non Af Amer: >60 mL/min (ref >60)
GLUCOSE: 113 mg/dL — AB (ref 65–99)
POTASSIUM: 3.5 mmol/L (ref 3.5–5.1)
SODIUM: 137 mmol/L (ref 135–145)

## 2016-06-05 LAB — TROPONIN I: Troponin I: <0.03 ng/mL (ref <0.03)

## 2016-06-05 NOTE — ED Triage Notes (Signed)
Pt speaks some English (Spanish is primary language) -- husband helping interpret for triage purposes. Pt reports chest pain, sob for approx 15 mins. Pt able to speak in complete sentences. Reports taking Cardizem and Xanax PTA.

## 2016-06-05 NOTE — ED Notes (Signed)
Pt is sleeping, no distress.

## 2016-06-05 NOTE — ED Provider Notes (Signed)
[facility] Provider Note  CSN: [id number] Arrival date & time: 06/05/16  1904 By signing my name below, I, Dyke Brackett, attest that this documentation has been prepared under the direction and in the presence of non-physician practitioner, Carmon Sails, PA-C. Electronically Signed: Dyke Brackett, Scribe. 06/05/2016. 10:30 PM.   History   Chief Complaint Chief Complaint  Patient presents with  . Chest Pain   The history is provided by the patient. No language interpreter was used.    HPI Comments: Christine Rosales is a 41 y.o. female with a history of tachycardia, long-standing palpitations, fibromyalgia, GAD and panic disorder who presents to the Emergency Department complaining of sudden onset, central chest pain/tightness which she noticed while cooking a few hours ago. Pt then took her BP which was 170/110; her HR was in the 80's. She took Cardizem and .25 mg of Xanax 2 hours ago with no relief.  She then developed SOB, shakiness and asked her husband to bring her to the ED.  Associated symptoms include headache, and a generalized "hot" sensation. She notes that for the past 3 days, she has had watery diarrhea up to 30 times per day. This has since stopped; no diarrhea today. Pt has a hx of panic attacks; her last panic attack was 1 year ago. Her symptoms today are not similar to prior panic attacks. Pt typically takes Cardizem and Xanax as needed for tachycardia and anxiety. She started taking Prozac 3 days ago for fibromyalgia.  Pt reports previous adverse reaction to SSRIs. Last time she took an SSRI, she had hot flashes, headache and elevated blood pressure. She denies any nausea or vomiting.   Past Medical History:  Diagnosis Date  . Anxiety   . Fibromyalgia   . Headache   . Palpitations   . TMJ (dislocation of temporomandibular joint)    Patient Active Problem List   Diagnosis Date Noted  . GAD (generalized anxiety disorder) 04/07/2015  . Panic disorder  without agoraphobia 04/07/2015  . Anxiety state 02/27/2015  . Palpitation 02/27/2015  . Fibromyalgia 02/27/2015  . 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 NO PAST SURGERIES     OB History    No data available     Home Medications    Prior to Admission medications   Medication Sig Start Date End Date Taking? Authorizing Provider  ALPRAZolam Duanne Moron) 0.25 MG tablet Take 0.25 mg by mouth as needed for anxiety.   Yes [provider]  baclofen (LIORESAL) 10 MG tablet Take 1 tablet (10 mg total) by mouth 2 (two) times daily. Reported on 06/09/2015 03/29/16  Yes Saguier, Percell Miller, PA-C  diltiazem (CARDIZEM) 30 MG tablet Take 30 mg by mouth as needed.   Yes [provider]  FLUoxetine (PROZAC) 10 MG tablet Take 10 mg by mouth daily.   Yes [provider]  cyclobenzaprine (FLEXERIL) 10 MG tablet Take 1 tablet (10 mg total) by mouth at bedtime. 06/25/15   Saguier, Percell Miller, PA-C  hydrOXYzine (ATARAX/VISTARIL) 25 MG tablet 1 tab po as needed severe anxiety or panic attack.(max total in any one day 3 tab and 8 hours apart) 10/14/15   Saguier, Percell Miller, PA-C  meloxicam (MOBIC) 7.5 MG tablet 1-2 tab po q day 03/10/16   Saguier, Percell Miller, PA-C  omeprazole (PRILOSEC) 20 MG capsule Take 1 capsule (20 mg total) by mouth 2 (two) times daily before a meal. 03/02/15   Saguier, Iris Pert   Family History Family History  Problem Relation Age of Onset  . Depression Mother   . Stroke Mother   . Hypertension Mother   . Depression Sister   . Heart murmur Sister   . Hypertension Father    Social History Social History  Substance Use Topics  . Smoking status: Never Smoker  . Smokeless tobacco: Never Used  . Alcohol use No   Allergies   Codeine; Sudafed [pseudoephedrine]; and Sulfa antibiotics  Review of Systems Review of Systems  Constitutional: Negative for chills and fever.  HENT: Negative for congestion and sore throat.   Respiratory:  Positive for chest tightness and shortness of breath. Negative for cough.   Cardiovascular: Positive for chest pain. Negative for palpitations and leg swelling.  Gastrointestinal: Positive for diarrhea (resolved). Negative for abdominal pain, constipation, nausea and vomiting.  Genitourinary: Negative for difficulty urinating and hematuria.  Musculoskeletal: Negative for back pain.  Skin: Negative for rash.  Neurological: Positive for tremors and headaches. Negative for dizziness, seizures, syncope, weakness, light-headedness and numbness.  Hematological: Does not bruise/bleed easily.  Psychiatric/Behavioral: Negative.    Physical Exam Updated Vital Signs BP 118/88 (BP Location: Left Arm)   Pulse 81   Temp 98.3 F (36.8 C) (Oral)   Resp 14   Ht 5\' 4"  (1.626 m)   Wt 58.1 kg   LMP 05/26/2016 (Approximate)   SpO2 98%   BMI 21.97 kg/m   Physical Exam  Constitutional: She is oriented to person, place, and time. She appears well-developed and well-nourished.  HENT:  Head: Normocephalic and atraumatic.  Nose: Nose normal.  Mouth/Throat: Oropharynx is clear and moist. No oropharyngeal exudate.  Eyes: Conjunctivae and EOM are normal. Pupils are equal, round, and reactive to light.  Neck: Normal range of motion. Neck supple. No JVD present.  Cardiovascular: Normal rate, regular rhythm, normal heart sounds and intact distal pulses.   No murmur heard. Pulmonary/Chest: Effort normal and breath sounds normal. No respiratory distress. She has no wheezes. She has no rales. She exhibits no tenderness.  Abdominal: Soft. Bowel sounds are normal. She exhibits no distension and no mass. There is no tenderness. There is no rebound and no guarding.  Musculoskeletal: Normal range of motion. She exhibits no deformity.  Lymphadenopathy:    She has no cervical adenopathy.  Neurological: She is alert and oriented to person, place, and time. No sensory deficit.  Skin: Skin is warm and dry. Capillary  refill takes less than 2 seconds.  Psychiatric: She has a normal mood and affect. Her behavior is normal. Judgment and thought content normal.  Nursing note and vitals reviewed.  ED Treatments / Results  DIAGNOSTIC STUDIES: Oxygen Saturation is 100% on RA, normal by my interpretation.   COORDINATION OF CARE: 10:26 PM-Discussed next steps with pt. Pt verbalized understanding and is agreeable with the plan.   Labs (all labs ordered are listed, but only abnormal results are displayed) Labs Reviewed  BASIC METABOLIC PANEL - Abnormal; Notable for the following:       Result Value   Glucose, Bld 113 (*)    All other components within normal limits  CBC - Abnormal; Notable for the following:    Hemoglobin 9.9 (*)    HCT 30.6 (*)    MCH 25.4 (*)    Platelets 411 (*)    All other components within normal limits  TROPONIN I    EKG  EKG Interpretation None      Radiology Dg Chest 2 View  Result Date: 06/05/2016 CLINICAL DATA:  Tachycardia,  chest pain, and left flank pain for several hours. Shortness of breath. Nonsmoker. EXAM: CHEST  2 VIEW COMPARISON:  Thoracic spine 06/25/2014 FINDINGS: The heart size and mediastinal contours are within normal limits. Both lungs are clear. The visualized skeletal structures are unremarkable. IMPRESSION: No active cardiopulmonary disease. Electronically Signed   By: Lucienne Capers M.D.   On: 06/05/2016 23:18   Procedures Procedures   Medications Ordered in ED Medications - No data to display  Initial Impression / Assessment and Plan / ED Course  I have reviewed the triage vital signs and the nursing notes.  Pertinent labs & imaging results that were available during my care of the patient were reviewed by me and considered in my medical decision making (see chart for details).    Pt is a 41 y.o. female presents with short episode of sudden onset CP >3 hours ago. No history of HTN, hypercholesterolemia, DM, obesity, smoking, positive family  hx, known CAD (previous MI, PCI/CABG, CVA/TIA or PAD).  On exam VS are within normal limits. RRR, symmetric pulses bilaterally.  Pt appears comfortable. Denies CP, SOB, nausea.  CXR, EKG, troponin x 1 within normal limits.  CBC and BMP unremarkable.  Heart score = 0.  PERC negative.  Patient did not require any medication in ED as she was asymptomatic. Patient is to be discharged with recommendation to follow up with PCP and cardiologist in regards to today's hospital visit. It is possible that chest pain could be due to cardiac etiology however given presentation, PERC negative, VS within normal limits and stable, no tracheal deviation, no JVD or new murmur, RRR, breath sounds equal bilaterally, and low risk HEART score, patient is low risk and is safe for discharge for outpatient cardiac work up.  Pt has been advised to return to the ED is CP becomes exertional, associated with diaphoresis or nausea, radiates to left jaw/arm, worsens or becomes concerning in any way. Pt appears reliable for follow up and is agreeable to discharge. Patient is in no acute distress. Vital Signs are within normal limits. Patient is able to ambulate. Patient able to tolerate PO.    Final Clinical Impressions(s) / ED Diagnoses   Final diagnoses:  Precordial pain   New Prescriptions New Prescriptions   No medications on file   I personally performed the services described in this documentation, which was scribed in my presence. The recorded information has been reviewed and is accurate.     Kinnie Feil, PA-C 06/06/16 0058    Rex Kras Wenda Overland, MD 06/08/16 [phone]

## 2016-06-05 NOTE — ED Notes (Signed)
No repeat EKG per PA.

## 2016-06-06 NOTE — Discharge Instructions (Signed)
Your lab work, EKG and chest x-ray were reassuring today. There is less than 1% chance that you will have a major cardiac event within the next 6-8 weeks. Please contact your cardiologist as soon as you are able to in order to make an appointment for further discussion of your symptoms today and today's ED visit. Please contact the provider who started you on Prozac for fibromyalgia, discuss your sudden symptoms today and discuss possible dose adjustments or discontinuation.  He or she may consider stopping your prozac given your previous adverse reaction to an SSRI and today's symptoms.

## 2016-06-06 NOTE — ED Notes (Signed)
Pt and SO given d/c instructions as per chart. Verbalize understanding. No questions. 

## 2016-06-15 ENCOUNTER — Encounter: Payer: Self-pay | Admitting: Gynecology

## 2016-06-17 ENCOUNTER — Encounter: Payer: Self-pay | Admitting: Internal Medicine

## 2016-06-17 ENCOUNTER — Ambulatory Visit (INDEPENDENT_AMBULATORY_CARE_PROVIDER_SITE_OTHER): Payer: No Typology Code available for payment source | Admitting: Internal Medicine

## 2016-06-17 VITALS — BP 110/60 | HR 69 | Ht 64.0 in | Wt 128.0 lb

## 2016-06-17 DIAGNOSIS — R0789 Other chest pain: Secondary | ICD-10-CM | POA: Diagnosis not present

## 2016-06-17 DIAGNOSIS — R002 Palpitations: Secondary | ICD-10-CM

## 2016-06-17 NOTE — Progress Notes (Signed)
Follow-up Outpatient Visit Date: 06/17/2016  Chief Complaint: Follow-up palpitations  HPI:  [name] is a 41 y.o. year-old female with history of long-standing palpitations, anxiety, and fibromyalgia, who presents for follow-up of palpitations. History is obtained with the assistance of Spanish interpreter via video feed. I last saw her on 04/22/16, which time she was doing relatively well. She continued to have intermittent palpitations, we obtained a 24-hour urine to exclude pheochromocytoma; this was negative. She has continued to use as needed diltiazem about once a week with good relief of her palpitations. She was started on fluoxetine by her PCP earlier this month. After taking the first dose, she. Worsening palpitations, chest pain, and shortness of breath in the setting of elevated blood pressure. She presented to the ED where workup was unrevealing. She has not taken fluoxetine or any other SSRI since that time. She notes that in the past, and SSRI precipitated blurry vision.  Since that time, [name] has not had any additional episodes of chest pain. As above, she continues to have occasional brief palpitations that seem to occur most often with stressful situations. 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 Workup also revealed positive rheumatoid factor as well as moderate anemia with a hemoglobin near 10. She has been prescribed estradiol and progesterone by her gynecologist.  --------------------------------------------------------------------------------------------------  Cardiovascular History & Procedures: Cardiovascular Problems:  Palpitations  Risk Factors:  None  Cath/PCI:  None  CV Surgery:  None  EP Procedures and Devices:  14-day event monitor (11/20/15): Predominantly sinus rhythm with sinus tachycardia (17% of monitoring period). No significant supraventricular or ventricular ectopy. No sustained  arrhythmias or prolonged pauses.  Non-Invasive Evaluation(s):  TTE (01/22/16): Normal LV size with LVEF is 60-65% with normal wall motion and diastolic function. Normal RV size and function. No significant valvular abnormalities. Normal pulmonary artery and central venous pressure.  Recent CV Pertinent Labs: Lab Results  Component Value Date   K 3.5 06/05/2016   MG 1.9 11/13/2015   BUN 15 06/05/2016   CREATININE 0.70 06/05/2016   CREATININE 0.79 11/13/2015    Past medical and surgical history were reviewed and updated in EPIC.   Outpatient Encounter Prescriptions as of 06/17/2016  Medication Sig  . ALPRAZolam (XANAX) 0.25 MG tablet Take 0.25 mg by mouth daily as needed for anxiety.   Marland Kitchen amitriptyline (ELAVIL) 10 MG tablet Take 10 mg by mouth at bedtime.  . baclofen (LIORESAL) 10 MG tablet Take 1 tablet (10 mg total) by mouth 2 (two) times daily. Reported on 06/09/2015  . cyclobenzaprine (FLEXERIL) 10 MG tablet Take 1 tablet (10 mg total) by mouth at bedtime.  Marland Kitchen diltiazem (CARDIZEM) 30 MG tablet Take 30 mg by mouth daily as needed (heart rate).   . fluticasone (FLONASE) 50 MCG/ACT nasal spray Place 2 sprays into both nostrils daily.  . hydrOXYzine (ATARAX/VISTARIL) 25 MG tablet Take 25 mg by mouth 3 (three) times daily as needed (severe anxiety or panic attack).  . meloxicam (MOBIC) 7.5 MG tablet Take 7.5-15 mg by mouth daily.  Marland Kitchen omeprazole (PRILOSEC) 20 MG capsule Take 1 capsule (20 mg total) by mouth 2 (two) times daily before a meal.  . [DISCONTINUED] FLUoxetine (PROZAC) 10 MG tablet Take 10 mg by mouth daily.  . [DISCONTINUED] hydrOXYzine (ATARAX/VISTARIL) 25 MG tablet 1 tab po as needed severe anxiety or panic attack.(max total in any one day 3 tab and 8 hours apart)  . [DISCONTINUED] meloxicam ([place])  7.5 MG tablet 1-2 tab po q day   No facility-administered encounter medications on file as of 06/17/2016.     Allergies: Sulfa antibiotics; Codeine; and Sudafed  [pseudoephedrine]  Social History   Social History  . Marital status: Married    Spouse name: N/A  . Number of children: N/A  . Years of education: N/A   Occupational History  . Not on file.   Social History Main Topics  . Smoking status: Never Smoker  . Smokeless tobacco: Never Used  . Alcohol use No  . Drug use: No  . Sexual activity: Yes    Birth control/ protection: Surgical, Other-see comments     Comment: husband has vascetomy   Other Topics Concern  . Not on file   Social History Narrative   Born and raised in [place] by parents. Pt has one older sister. Mom suffered from depression and it affected how she raised the pt. Pt has a college degree in Associate Emergency Medicine. Pt moved to [place] in 2009 due to her husband's job. Pt has bee married 14 yrs and has 2 kids. 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     Family History  Problem Relation Age of Onset  . Depression Mother   . Stroke Mother   . Hypertension Mother   . Depression Sister   . Heart murmur Sister   . Hypertension Father     Review of Systems: A 12-system review of systems was performed and was negative except as noted in the HPI.  --------------------------------------------------------------------------------------------------  Physical Exam: BP 110/60   Pulse 69   Ht 5\' 4"  (1.626 m)   Wt 128 lb (58.1 kg)   LMP 05/26/2016 (Approximate)   BMI 21.97 kg/m   General:  Well-developed, well-nourished woman, seated comfortably in the exam room. HEENT: There is mild conjunctival pallor without scleral icterus.  Moist mucous membranes.  OP clear. Neck: Supple without lymphadenopathy, thyromegaly, JVD, or HJR. Lungs: Normal work of breathing.  Clear to auscultation bilaterally without wheezes or crackles. Heart: Regular rate and rhythm without murmurs, rubs, or gallops. Physiologic splitting of S2 is noted. Non-displaced PMI. Abd: Bowel sounds present.   Soft, NT/ND without hepatosplenomegaly Ext: No lower extremity edema.  Radial, PT, and DP pulses are 2+ bilaterally. Skin: Warm and dry without rash.  Lab Results  Component Value Date   WBC 8.7 06/05/2016   HGB 9.9 (L) 06/05/2016   HCT 30.6 (L) 06/05/2016   MCV 78.7 06/05/2016   PLT 411 (H) 06/05/2016    Lab Results  Component Value Date   NA 137 06/05/2016   K 3.5 06/05/2016   CL 104 06/05/2016   CO2 25 06/05/2016   BUN 15 06/05/2016   CREATININE 0.70 06/05/2016   GLUCOSE 113 (H) 06/05/2016   ALT 7 02/27/2015   Lab Results  Component Value Date   TSH 0.79 11/13/2015   --------------------------------------------------------------------------------------------------  ASSESSMENT AND PLAN: Palpitations [name] continues to have occasional palpitations, though they seem to be relatively well controlled. We have agreed to continue with as needed diltiazem. I have advised her to refrain from using fluoxetine again, due to the reaction she experienced after her first dose. She should discuss other treatment options with her PCP.  Atypical chest pain The patient has a history of intermittent chest pain, sometimes accompanying her palpitations and at other times occurring independently. Though I have a low suspicion for obstructive CAD, given the recurrent  nature of these episodes, we have agreed to obtain an exercise tolerance test for further evaluation. If this is unrevealing, additional cardiovascular testing can be deferred.  Follow-up: Return to clinic in 4 months.  [name], MD 06/18/2016 1:18 PM

## 2016-06-17 NOTE — Patient Instructions (Addendum)
Medication Instructions:  Your physician recommends that you continue on your current medications as directed. Please refer to the Current Medication list given to you today.   Labwork: none  Testing/Procedures: Your physician has requested that you have an exercise tolerance test. For further information please visit [email or web address]. Please also follow instruction sheet, as given.    Follow-Up: Your physician recommends that you schedule a follow-up appointment in: 4 months with Dr End.        If you need a refill on your cardiac medications before your next appointment, please call your pharmacy.   Instructions reviewed with patient through [place]

## 2016-06-18 ENCOUNTER — Encounter: Payer: Self-pay | Admitting: Internal Medicine

## 2016-07-01 ENCOUNTER — Ambulatory Visit (INDEPENDENT_AMBULATORY_CARE_PROVIDER_SITE_OTHER): Payer: No Typology Code available for payment source

## 2016-07-01 DIAGNOSIS — R0789 Other chest pain: Secondary | ICD-10-CM | POA: Diagnosis not present

## 2016-07-01 LAB — EXERCISE TOLERANCE TEST
CHL CUP MPHR: 182 {beats}/min
CSEPEDS: 0 s
CSEPHR: 90 %
Estimated workload: 7 METS
Exercise duration (min): 5 min
Peak HR: 164 {beats}/min
RPE: 17
Rest HR: 83 {beats}/min

## 2016-09-22 ENCOUNTER — Ambulatory Visit (INDEPENDENT_AMBULATORY_CARE_PROVIDER_SITE_OTHER): Payer: No Typology Code available for payment source | Admitting: Medical

## 2016-09-22 VITALS — BP 124/91 | HR 87 | Temp 97.7°F | Resp 16 | Ht 64.0 in | Wt 130.7 lb

## 2016-09-22 DIAGNOSIS — S46819A Strain of other muscles, fascia and tendons at shoulder and upper arm level, unspecified arm, initial encounter: Secondary | ICD-10-CM | POA: Diagnosis not present

## 2016-09-22 DIAGNOSIS — R519 Headache, unspecified: Secondary | ICD-10-CM

## 2016-09-22 DIAGNOSIS — H00014 Hordeolum externum left upper eyelid: Secondary | ICD-10-CM | POA: Diagnosis not present

## 2016-09-22 DIAGNOSIS — R51 Headache: Secondary | ICD-10-CM

## 2016-09-22 MED ORDER — KETOROLAC TROMETHAMINE 30 MG/ML IJ SOLN
30.0000 mg | Freq: Once | INTRAMUSCULAR | Status: AC
  Administered 2016-09-22: 30 mg via INTRAMUSCULAR

## 2016-09-22 MED ORDER — BACLOFEN 10 MG PO TABS: 10.0000 mg | ORAL_TABLET | Freq: Two times a day (BID) | ORAL | Status: DC

## 2016-09-22 MED ORDER — TOBRAMYCIN 0.3 % OP SOLN: 2.0000 [drp] | Freq: Four times a day (QID) | OPHTHALMIC | 0 refills | Status: DC

## 2016-09-22 MED ORDER — KETOROLAC TROMETHAMINE 30 MG/ML IJ SOLN: 30.0000 mg | Freq: Once | INTRAMUSCULAR | Status: DC

## 2016-09-22 NOTE — Patient Instructions (Addendum)
For your headaches which appeared to be probable migraine versus tension headache, I will give Toradol 30 mg today in the office. Later if the headache persists then restart your baclofen. If you have headache with motor or neurologic deficits as discussed then be seen in the emergency department.  For your left stye will prescribe. Tobrex eyedrops. Can also apply warm compresses twice daily.  Regarding your history of various joint pains and muscle aches and requested a work excuse, I would ask that you schedule another appointment and bring in [facility] paperwork. I think this is the best way to approach as you're asking for work reduction from 40 hours a week to 20 hours a week. Also I would like for you to schedule that appointment after the rheumatology workup is done. This would help me fill out the forms with appropriate diagnosis.  Follow-up in 2-3 weeks or as needed.

## 2016-09-22 NOTE — Progress Notes (Signed)
Subjective:    Patient ID: Christine Rosales, female    DOB: October 28, 1975, 41 y.o.   MRN: [id number]  HPI  Pt in with left side headache. This headache started since Tuesday. Pt does have ha in the past. Hx of migraine ha and has seen neurologist. Pt in past used baclofen for ha and states helps a lot. She does not some neck pain often with ha. Pt does have sound and light sensitivity. Often ha start in her neck.  Presently no gross motor or sensory function deficits.No fevers chills sweats or neck stiffness with headache.   Pt had small lump/bump left upper eyelid started on Friday. The lid hurts a little. Mild. No eye dc.  LMP- past Friday.  Pt also mentioned that she has tendinitis of arms and fibromyalgia. For this she wants work note/fmla to work part time.   Review of Systems  Constitutional: Negative for chills, fatigue and fever.  HENT: Negative for congestion, ear pain, facial swelling, mouth sores, nosebleeds, postnasal drip and rhinorrhea.   Eyes: Negative for pain, redness, itching and visual disturbance.       Lt eye lid stye.  Respiratory: Negative for cough, chest tightness, shortness of breath and wheezing.   Cardiovascular: Negative for chest pain and palpitations.  Gastrointestinal: Negative for abdominal pain.  Musculoskeletal: Negative for back pain, gait problem, myalgias and neck stiffness.  Skin: Negative for rash.  Neurological: Positive for headaches. Negative for dizziness, seizures, speech difficulty, weakness and light-headedness.       See hpi.  Hematological: Negative for adenopathy. Does not bruise/bleed easily.  Psychiatric/Behavioral: Negative for behavioral problems and confusion.    Past Medical History:  Diagnosis Date  . Anxiety   . Fibromyalgia   . Headache   . Palpitations   . TMJ (dislocation of temporomandibular joint)      Social History   Social History  . Marital status: Married    Spouse name: N/A  . Number of children: N/A    . Years of education: N/A   Occupational History  . Not on file.   Social History Main Topics  . Smoking status: Never Smoker  . Smokeless tobacco: Never Used  . Alcohol use No  . Drug use: No  . Sexual activity: Yes    Birth control/ protection: Surgical, Other-see comments     Comment: husband has vascetomy   Other Topics Concern  . Not on file   Social History Narrative   Born and raised in [place] by parents. Pt has one older sister. Mom suffered from depression and it affected how she raised the pt. Pt has a college degree in Associate Emergency Medicine. Pt moved to [place] in 2009 due to her husband's job. Pt has bee married 14 yrs and has 2 kids. 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     Past Surgical History:  Procedure Laterality Date  . NO PAST SURGERIES      Family History  Problem Relation Age of Onset  . Depression Mother   . Stroke Mother   . Hypertension Mother   . Depression Sister   . Heart murmur Sister   . Hypertension Father     Allergies  Allergen Reactions  . Sulfa Antibiotics Other (See Comments), Palpitations and Rash    Syncope Syncope Rapid heartrate  . Codeine Rash  . Sudafed [Pseudoephedrine] Rash    Current Outpatient Prescriptions on File Prior to Visit  Medication Sig Dispense Refill  . ALPRAZolam (XANAX) 0.25 MG tablet Take 0.25 mg by mouth daily as needed for anxiety.     Marland Kitchen amitriptyline (ELAVIL) 10 MG tablet Take 10 mg by mouth at bedtime.    . baclofen (LIORESAL) 10 MG tablet Take 1 tablet (10 mg total) by mouth 2 (two) times daily. Reported on 06/09/2015 60 each 00  . cyclobenzaprine (FLEXERIL) 10 MG tablet Take 1 tablet (10 mg total) by mouth at bedtime. 30 tablet 0  . diltiazem (CARDIZEM) 30 MG tablet Take 30 mg by mouth daily as needed (heart rate).     . fluticasone (FLONASE) 50 MCG/ACT nasal spray Place 2 sprays into both nostrils daily.    . hydrOXYzine (ATARAX/VISTARIL) 25 MG tablet  Take 25 mg by mouth 3 (three) times daily as needed (severe anxiety or panic attack).    . meloxicam (MOBIC) 7.5 MG tablet Take 7.5-15 mg by mouth daily.    Marland Kitchen omeprazole (PRILOSEC) 20 MG capsule Take 1 capsule (20 mg total) by mouth 2 (two) times daily before a meal. 60 capsule 3   No current facility-administered medications on file prior to visit.     BP (!) 124/91   Pulse 87   Temp 97.7 F (36.5 C) (Oral)   Resp 16   Ht 5\' 4"  (1.626 m)   Wt 130 lb 11.2 oz (59.3 kg)   SpO2 100%   BMI 22.43 kg/m       Objective:   Physical Exam  General Mental Status- Alert. General Appearance- Not in acute distress.   Eyes-pupils equal round reactive to light. Conjunctiva clear bilaterally. Left upper eyelid mid aspect moderate sized stye. No discharge.  Skin General: Color- Normal Color. Moisture- Normal Moisture. On inspection of patient's left side of face she has no rash or vesicular eruption.  Neck Carotid Arteries- Normal color. Moisture- Normal Moisture. No carotid bruits. No JVD. No neck stiffness and she does have tenderness to both trapezius bilaterally on palpation. But worse on the left side.  Chest and Lung Exam Auscultation: Breath Sounds:-Normal.  Cardiovascular Auscultation:Rythm- Regular. Murmurs & Other Heart Sounds:Auscultation of the heart reveals- No Murmurs.  Abdomen Inspection:-Inspeection Normal. Palpation/Percussion:Note:No mass. Palpation and Percussion of the abdomen reveal- Non Tender, Non Distended + BS, no rebound or guarding.   Neurologic Cranial Nerve exam:- CN III-XII intact(No nystagmus), symmetric smile. Drift Test:- No drift. Finger to Nose:- Normal/Intact  Strength:- 5/5 equal and symmetric strength both upper and lower extremities.      Assessment & Plan:  For your headaches which appeared to be probable migraine versus tension headache, I will give Toradol 30 mg today in the office. Later if the headache persists then restart your  baclofen. If you have headache with motor or neurologic deficits as discussed then be seen in the emergency department.  For your left stye will prescribe. Tobrex eyedrops. Can also apply warm compresses twice daily.  Regarding your history of various joint pains and muscle aches and requested a work excuse, I would ask that you schedule another appointment and bring in [facility] paperwork. I think this is the best way to approach as you're asking for work reduction from 40 hours a week to 20 hours a week. Also I would like for you to schedule that appointment after the rheumatology workup is done. This would help me fill out the forms with appropriate diagnosis.  Follow-up in 2-3 weeks or as needed.  Elysha Daw, Percell Miller, PA-C

## 2016-09-29 ENCOUNTER — Encounter: Payer: Self-pay | Admitting: Medical

## 2016-09-29 ENCOUNTER — Ambulatory Visit (INDEPENDENT_AMBULATORY_CARE_PROVIDER_SITE_OTHER): Payer: No Typology Code available for payment source | Admitting: Medical

## 2016-09-29 ENCOUNTER — Telehealth: Payer: Self-pay | Admitting: Medical

## 2016-09-29 VITALS — BP 118/83 | HR 81 | Temp 98.1°F | Ht 64.0 in | Wt 130.0 lb

## 2016-09-29 DIAGNOSIS — T148XXA Other injury of unspecified body region, initial encounter: Secondary | ICD-10-CM | POA: Diagnosis not present

## 2016-09-29 DIAGNOSIS — R42 Dizziness and giddiness: Secondary | ICD-10-CM | POA: Diagnosis not present

## 2016-09-29 DIAGNOSIS — D649 Anemia, unspecified: Secondary | ICD-10-CM | POA: Diagnosis not present

## 2016-09-29 DIAGNOSIS — J029 Acute pharyngitis, unspecified: Secondary | ICD-10-CM | POA: Diagnosis not present

## 2016-09-29 LAB — CBC WITH DIFFERENTIAL/PLATELET
BASOS PCT: 0.6 % (ref 0.0–3.0)
Basophils Absolute: 0.1 10*3/uL (ref 0.0–0.1)
Eosinophils Absolute: 0.1 10*3/uL (ref 0.0–0.7)
Eosinophils Relative: 0.8 % (ref 0.0–5.0)
HEMATOCRIT: 36.8 % (ref 36.0–46.0)
Hemoglobin: 11.8 g/dL — ABNORMAL LOW (ref 12.0–15.0)
LYMPHS ABS: 2.8 10*3/uL (ref 0.7–4.0)
Lymphocytes Relative: 27.6 % (ref 12.0–46.0)
MCHC: 32.1 g/dL (ref 30.0–36.0)
MCV: 79.8 fl (ref 78.0–100.0)
MONO ABS: 0.8 10*3/uL (ref 0.1–1.0)
Monocytes Relative: 8.2 % (ref 3.0–12.0)
NEUTROS ABS: 6.4 10*3/uL (ref 1.4–7.7)
NEUTROS PCT: 62.8 % (ref 43.0–77.0)
PLATELETS: 423 10*3/uL — AB (ref 150.0–400.0)
RBC: 4.61 Mil/uL (ref 3.87–5.11)
RDW: 18.3 % — AB (ref 11.5–15.5)
WBC: 10.2 10*3/uL (ref 4.0–10.5)

## 2016-09-29 LAB — COMPREHENSIVE METABOLIC PANEL
ALT: 8 U/L (ref 0–35)
AST: 15 U/L (ref 0–37)
Albumin: 4.4 g/dL (ref 3.5–5.2)
Alkaline Phosphatase: 69 U/L (ref 39–117)
BUN: 12 mg/dL (ref 6–23)
CALCIUM: 9.6 mg/dL (ref 8.4–10.5)
CHLORIDE: 101 meq/L (ref 96–112)
CO2: 30 meq/L (ref 19–32)
CREATININE: 0.77 mg/dL (ref 0.40–1.20)
GFR: 88.59 mL/min (ref >60.00)
GLUCOSE: 93 mg/dL (ref 70–99)
Potassium: 3.8 mEq/L (ref 3.5–5.1)
Sodium: 137 mEq/L (ref 135–145)
Total Bilirubin: 0.4 mg/dL (ref 0.2–1.2)
Total Protein: 7.6 g/dL (ref 6.0–8.3)

## 2016-09-29 LAB — POCT RAPID STREP A (OFFICE): Rapid Strep A Screen: POSITIVE — AB

## 2016-09-29 MED ORDER — AZITHROMYCIN 250 MG PO TABS: ORAL_TABLET | ORAL | 0 refills | Status: DC

## 2016-09-29 MED ORDER — PENICILLIN G BENZATHINE & PROC 1200000 UNIT/2ML IM SUSP
1.2000 10*6.[IU] | Freq: Once | INTRAMUSCULAR | Status: AC
  Administered 2016-09-29: 1.2 10*6.[IU] via INTRAMUSCULAR

## 2016-09-29 NOTE — Progress Notes (Signed)
Subjective:    Patient ID: Christine Rosales, female    DOB: [birth year], 41 y.o.   MRN: [id number]  HPI  Pt in states that night the day I saw her she had swelling of lymph nodes in her neck that felt swollen. Base of skull. Also some swelling of glands submandibular area. Throat has hurt a lot to swollen.   These symptoms started on Thursday. Worse st yesterday. But Friday had chills and sweats.  Pt admits not hydrating well or eating much. She states she took baclofen to help her body aches and took various otc meds for symptom.   Pt was walking in her kitchen felt dizzy and fell. No loss of consciousness. No head trauma. Before she fell had no chest pain or palpitations.  She has small bruise on rt side flank area.     Review of Systems  Constitutional: Positive for chills, fatigue and fever.  HENT: Positive for sore throat. Negative for congestion, drooling, ear pain, mouth sores, nosebleeds, postnasal drip, sinus pain, sinus pressure and trouble swallowing.        Pain swallowing.  Cardiovascular: Negative for chest pain and palpitations.  Gastrointestinal: Negative for abdominal pain, constipation and diarrhea.  Musculoskeletal: Negative for back pain.       Diffuse back pain and arm pains.  Skin: Negative for rash.  Neurological: Negative for dizziness, seizures, speech difficulty, weakness and light-headedness.       Only dizzy yesterday but not today.  Hematological: Positive for adenopathy. Does not bruise/bleed easily.  Psychiatric/Behavioral: Negative for behavioral problems, confusion and sleep disturbance. The patient is not nervous/anxious.    Past Medical History:  Diagnosis Date  . Anxiety   . Fibromyalgia   . Headache   . Palpitations   . TMJ (dislocation of temporomandibular joint)      Social History   Social History  . Marital status: Married    Spouse name: N/A  . Number of children: N/A  . Years of education: N/A   Occupational History  .  Not on file.   Social History Main Topics  . Smoking status: Never Smoker  . Smokeless tobacco: Never Used  . Alcohol use No  . Drug use: No  . Sexual activity: Yes    Birth control/ protection: Surgical, Other-see comments     Comment: husband has vascetomy   Other Topics Concern  . Not on file   Social History Narrative   Born and raised in [place] by parents. Pt has one older sister. Mom suffered from depression and it affected how she raised the pt. Pt has a college degree in Associate Emergency Medicine. Pt moved to [place] in 2009 due to her husband's job. Pt has bee married 14 yrs and has 2 kids. 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     Past Surgical History:  Procedure Laterality Date  . NO PAST SURGERIES      Family History  Problem Relation Age of Onset  . Depression Mother   . Stroke Mother   . Hypertension Mother   . Depression Sister   . Heart murmur Sister   . Hypertension Father     Allergies  Allergen Reactions  . Sulfa Antibiotics Other (See Comments), Palpitations and Rash    Syncope Syncope Rapid heartrate  . Codeine Rash  . Sudafed [Pseudoephedrine] Rash    Current Outpatient Prescriptions on File Prior to Visit  Medication Sig Dispense Refill  .  ALPRAZolam (XANAX) 0.25 MG tablet Take 0.25 mg by mouth daily as needed for anxiety.     Marland Kitchen amitriptyline (ELAVIL) 10 MG tablet Take 10 mg by mouth at bedtime.    . baclofen (LIORESAL) 10 MG tablet Take 1 tablet (10 mg total) by mouth 2 (two) times daily. Reported on 06/09/2015 60 each 00  . diltiazem (CARDIZEM) 30 MG tablet Take 30 mg by mouth daily as needed (heart rate).     Marland Kitchen estradiol (VIVELLE-DOT) 0.0375 MG/24HR Place 1 patch onto the skin once a week.    . fluticasone (FLONASE) 50 MCG/ACT nasal spray Place 2 sprays into both nostrils daily.    . hydrOXYzine (ATARAX/VISTARIL) 25 MG tablet Take 25 mg by mouth 3 (three) times daily as needed (severe anxiety or  panic attack).    . meloxicam (MOBIC) 7.5 MG tablet Take 7.5-15 mg by mouth daily.    Marland Kitchen omeprazole (PRILOSEC) 20 MG capsule Take 1 capsule (20 mg total) by mouth 2 (two) times daily before a meal. 60 capsule 3  . progesterone (PROMETRIUM) 100 MG capsule Take 100 mg by mouth daily.    Marland Kitchen tobramycin (TOBREX) 0.3 % ophthalmic solution Place 2 drops into the left eye every 6 (six) hours. 5 mL 0   No current facility-administered medications on file prior to visit.     BP 118/83   Pulse 81   Temp 98.1 F (36.7 C) (Oral)   Ht 5\' 4"  (1.626 m)   Wt 130 lb (59 kg)   LMP 09/29/2016 (Exact Date)   SpO2 99%   BMI 22.31 kg/m       Objective:   Physical Exam  General  Mental Status - Alert. General Appearance - Well groomed. Not in acute distress.  Skin Rashes- No Rashes. Up of her right iliac crest she has a small bruise.  HEENT Head- Normal. Ear Auditory Canal - Left- Normal. Right - Normal.Tympanic Membrane- Left- Normal. Right- Normal. Eye Sclera/Conjunctiva- Left- Normal. Right- Normal. Nose & Sinuses Nasal Mucosa- Left-  Faint Boggy and Congested. Right-  Faint  Boggy and  Congested.Bilateral  No maxillary and  No frontal sinus pressure. Mouth & Throat Lips: Upper Lip- Normal: no dryness, cracking, pallor, cyanosis, or vesicular eruption. Lower Lip-Normal: no dryness, cracking, pallor, cyanosis or vesicular eruption. Buccal Mucosa- Bilateral- No Aphthous ulcers. Oropharynx- No Discharge or Erythema. Tonsils: Characteristics- Bilateral- No Erythema or Congestion. Size/Enlargement- Bilateral- No enlargement. Discharge- bilateral-None.  Neck Neck- Supple. No Masses.   Chest and Lung Exam Auscultation: Breath Sounds:-Clear even and unlabored.  Cardiovascular Auscultation:Rythm- Regular, rate and rhythm. Murmurs & Other Heart Sounds:Ausculatation of the heart reveal- No Murmurs.  Lymphatic Head & Neck General Head & Neck Lymphatics: Bilateral: Description- No Localized  lymphadenopathy.  Right hip-on direct palpation of her right hip no pain. On range of motion crepitus and no pain. On palpation of her iliac crest region reports no pain.        Assessment & Plan:  For strep throat we gave you Bicillin IM injection. Also will prescribe azithromycin for 5 days.  Rest and hydrate. Tylenol or ibuprofen for fever.   I do think the dizziness and fall was likely secondary to being sick with strep, mild dehydration and side effect of baclofen. Patient used a hydrated and start to eat. The throat pain should start to decrease within 24 hours and you should be able to tolerate foods better.   On review of labs you do have some recent anemia and I  do want to recheck that today. Will get a metabolic panel at that time.   The bruise on on her right side is minimal. Based on your exam I don't think you need x-rays of the hip area. But if your hip region does start hurting then will get x-rays.   Follow-up in 7-10 days or as needed.   Note patient did give me some paperwork regarding disability. She probably does need to reduce her work in light of her medical problems. I discussed willing to advise 4 hour work days. But I was thinking of filling out FMLA forms rather than the form that states disability. So I'll review the form and if I do fill it out all make note regarding her ability to work part time. Also might need to get some specialist opinions regarding work capacity as well.  Alvina Strother, Percell Miller, PA-C

## 2016-09-29 NOTE — Telephone Encounter (Signed)
Pt dropped off document of medical records to have on her chart and for provider to see. Document put at front office tray. 4 pages

## 2016-09-29 NOTE — Progress Notes (Signed)
Pt was given 1.53million units of bicillin in upper outer ventroglueteal muscle at 1400. Tolerated treatment well.

## 2016-09-29 NOTE — Patient Instructions (Signed)
For strep throat we gave you Bicillin IM injection. Also will prescribe azithromycin for 5 days.  Rest and hydrate. Tylenol or ibuprofen for fever.   I do think the dizziness and fall was likely secondary to being sick with strep, mild dehydration and side effect of baclofen. Patient used a hydrated and start to eat. The throat pain should start to decrease within 24 hours and you should be able to tolerate foods better.   On review of labs you do have some recent anemia and I do want to recheck that today. Will get a metabolic panel at that time.   The bruise on on her right side is minimal. Based on your exam I don't think you need x-rays of the hip area. But if your hip region does start hurting then will get x-rays.   Follow-up in 7-10 days or as needed.

## 2016-09-29 NOTE — Telephone Encounter (Signed)
Pt dropped off documents to be filled out by provider (4 pages of [place]). Please call pt at [phone] when document ready. Document put at front office tray.

## 2016-09-30 ENCOUNTER — Encounter: Payer: Self-pay | Admitting: *Deleted

## 2016-10-04 ENCOUNTER — Telehealth: Payer: Self-pay | Admitting: Medical

## 2016-10-04 NOTE — Telephone Encounter (Signed)
Paperwork and lab medical records forwarded to provider/SLS 09/04

## 2016-10-04 NOTE — Telephone Encounter (Signed)
Caller name: Anasia Relation to pt: self  Call back number: [phone] Pharmacy:    Reason for call: Pt states is needing refill for ALPRAZolam (XANAX) 0.25 MG tablet. Please advise.

## 2016-10-05 ENCOUNTER — Ambulatory Visit: Payer: No Typology Code available for payment source

## 2016-10-05 ENCOUNTER — Ambulatory Visit: Payer: No Typology Code available for payment source | Admitting: Medical

## 2016-10-05 NOTE — Telephone Encounter (Signed)
Pt is requesting Alprazolam I don't see where we have prescribed medication. Please advise

## 2016-10-05 NOTE — Telephone Encounter (Signed)
Pt needs appointment to discuss xanax use. Might need contract and uds.

## 2016-10-06 ENCOUNTER — Ambulatory Visit: Payer: No Typology Code available for payment source | Admitting: Medical

## 2016-10-06 NOTE — Telephone Encounter (Signed)
Pt is informed and made an appt.

## 2016-10-07 ENCOUNTER — Encounter: Payer: Self-pay | Admitting: Medical

## 2016-10-07 ENCOUNTER — Ambulatory Visit (INDEPENDENT_AMBULATORY_CARE_PROVIDER_SITE_OTHER): Payer: No Typology Code available for payment source | Admitting: Medical

## 2016-10-07 VITALS — BP 120/88 | HR 81 | Temp 98.4°F | Resp 16 | Ht 64.0 in | Wt 128.8 lb

## 2016-10-07 DIAGNOSIS — J029 Acute pharyngitis, unspecified: Secondary | ICD-10-CM

## 2016-10-07 DIAGNOSIS — F329 Major depressive disorder, single episode, unspecified: Secondary | ICD-10-CM | POA: Diagnosis not present

## 2016-10-07 DIAGNOSIS — F419 Anxiety disorder, unspecified: Secondary | ICD-10-CM | POA: Diagnosis not present

## 2016-10-07 DIAGNOSIS — F32A Depression, unspecified: Secondary | ICD-10-CM

## 2016-10-07 MED ORDER — CLONAZEPAM 0.5 MG PO TABS: ORAL_TABLET | ORAL | 0 refills | Status: DC

## 2016-10-07 MED ORDER — TRAZODONE HCL 50 MG PO TABS: 25.0000 mg | ORAL_TABLET | Freq: Every evening | ORAL | 3 refills | Status: DC | PRN

## 2016-10-07 NOTE — Patient Instructions (Addendum)
Your throat pain is much improved with mildly inflamed/tender right submandibular node. Your rapid strep test was negative and we will send out throat culture as well to make sure no persisting infection. I think your symptoms are within normal variant post tx. If your symptoms worsen or change pending send out throat culture result please let us know.  For your anxiety I am prescribing clonazepam. Use half a tablet for severe anxiety or panic attacks. If 30 minutes after the half tab doses, you still have anxiety can take second half tablet.  For depression and described on unsettled sleep, I am prescribing trazodone. This is different medication then he SSRIs which caused side effects.  Follow-up in 2-3 weeks or as needed.

## 2016-10-07 NOTE — Progress Notes (Signed)
Subjective:    Patient ID: Christine Rosales, female    DOB: 1975/11/02, 41 y.o.   MRN: [id number]  HPI  Pt in for follow up.   Pt  had strep + on last visit. Pt given bicillin and azithromycin. Faint mild st when she swallows and she feels rt submandibular not tender. Much less than before.   Pt states been using xanax for anxiety/panic attacks since 2012. Pt states she only uses 0.25 mg a day at most. But in the past most of time uses 0.125 q day. Most recently got from Dr. Amalia Hailey. Pt not sure if psychiatrist or IM. She was told needs a follow up before any refills. But no appointment until 4 months. Pt is not on contract nor has she given Dr Amalia Hailey uds. Pt feels like xanax no longer working well. She states in past only used xanax about one tablet a week. She reports some depression at times mood varies. Side effects with ssri. Sometimes restless sleep.   Review of Systems  Constitutional: Negative for chills, fatigue and fever.  HENT: Positive for sore throat. Negative for congestion, drooling, tinnitus, trouble swallowing and voice change.        See hpi.  Respiratory: Negative for chest tightness, shortness of breath and wheezing.   Gastrointestinal: Negative for abdominal pain, blood in stool, constipation, diarrhea, rectal pain and vomiting.  Genitourinary: Negative for decreased urine volume, dysuria, enuresis, flank pain, frequency and pelvic pain.  Musculoskeletal: Negative for back pain and myalgias.  Neurological: Negative for dizziness, light-headedness and headaches.  Hematological: Negative for adenopathy. Does not bruise/bleed easily.  Psychiatric/Behavioral: Positive for dysphoric mood. Negative for agitation, behavioral problems, decreased concentration, hallucinations, self-injury, sleep disturbance and suicidal ideas. The patient is nervous/anxious. The patient is not hyperactive.    Past Medical History:  Diagnosis Date  . Anxiety   . Fibromyalgia   .  Headache   . Palpitations   . TMJ (dislocation of temporomandibular joint)      Social History   Social History  . Marital status: Married    Spouse name: N/A  . Number of children: N/A  . Years of education: N/A   Occupational History  . Not on file.   Social History Main Topics  . Smoking status: Never Smoker  . Smokeless tobacco: Never Used  . Alcohol use No  . Drug use: No  . Sexual activity: Yes    Birth control/ protection: Surgical, Other-see comments     Comment: husband has vascetomy   Other Topics Concern  . Not on file   Social History Narrative   Born and raised in [place] by parents. Pt has one older sister. Mom suffered from depression and it affected how she raised the pt. Pt has a college degree in Associate Emergency Medicine. Pt moved to [place] in 2009 due to her husband's job. Pt has bee married 14 yrs and has 2 kids. 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     Past Surgical History:  Procedure Laterality Date  . NO PAST SURGERIES      Family History  Problem Relation Age of Onset  . Depression Mother   . Stroke Mother   . Hypertension Mother   . Depression Sister   . Heart murmur Sister   . Hypertension Father     Allergies  Allergen Reactions  . Sulfa Antibiotics Other (See Comments), Palpitations and Rash    Syncope Syncope  Rapid heartrate  . Codeine Rash  . Sudafed [Pseudoephedrine] Rash    Current Outpatient Prescriptions on File Prior to Visit  Medication Sig Dispense Refill  . ALPRAZolam (XANAX) 0.25 MG tablet Take 0.25 mg by mouth daily as needed for anxiety.     Marland Kitchen azithromycin (ZITHROMAX) 250 MG tablet Take 2 tablets by mouth on day 1, followed by 1 tablet by mouth daily for 4 days. 6 tablet 0  . baclofen (LIORESAL) 10 MG tablet Take 1 tablet (10 mg total) by mouth 2 (two) times daily. Reported on 06/09/2015 60 each 00  . diltiazem (CARDIZEM) 30 MG tablet Take 30 mg by mouth daily as needed  (heart rate).     Marland Kitchen estradiol (VIVELLE-DOT) 0.0375 MG/24HR Place 1 patch onto the skin once a week.    Marland Kitchen FLUoxetine (PROZAC) 20 MG tablet Take 20 mg by mouth daily.    . fluticasone (FLONASE) 50 MCG/ACT nasal spray Place 2 sprays into both nostrils daily.    . hydrOXYzine (ATARAX/VISTARIL) 25 MG tablet Take 25 mg by mouth 3 (three) times daily as needed (severe anxiety or panic attack).    . meloxicam (MOBIC) 7.5 MG tablet Take 7.5-15 mg by mouth daily.    Marland Kitchen omeprazole (PRILOSEC) 20 MG capsule Take 1 capsule (20 mg total) by mouth 2 (two) times daily before a meal. 60 capsule 3  . progesterone (PROMETRIUM) 100 MG capsule Take 100 mg by mouth daily.    Marland Kitchen tobramycin (TOBREX) 0.3 % ophthalmic solution Place 2 drops into the left eye every 6 (six) hours. 5 mL 0   No current facility-administered medications on file prior to visit.     BP 120/88   Pulse 81   Temp 98.4 F (36.9 C) (Oral)   Resp 16   Ht 5\' 4"  (1.626 m)   Wt 128 lb 12.8 oz (58.4 kg)   LMP 09/29/2016 (Exact Date)   SpO2 100%   BMI 22.11 kg/m     Past Medical History:  Diagnosis Date  . Anxiety   . Fibromyalgia   . Headache   . Palpitations   . TMJ (dislocation of temporomandibular joint)      Social History   Social History  . Marital status: Married    Spouse name: N/A  . Number of children: N/A  . Years of education: N/A   Occupational History  . Not on file.   Social History Main Topics  . Smoking status: Never Smoker  . Smokeless tobacco: Never Used  . Alcohol use No  . Drug use: No  . Sexual activity: Yes    Birth control/ protection: Surgical, Other-see comments     Comment: husband has vascetomy   Other Topics Concern  . Not on file   Social History Narrative   Born and raised in [place] by parents. Pt has one older sister. Mom suffered from depression and it affected how she raised the pt. Pt has a college degree in Associate Emergency Medicine. Pt moved to [place] in 2009 due to her  husband's job. Pt has bee married 14 yrs and has 2 kids. 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     Past Surgical History:  Procedure Laterality Date  . NO PAST SURGERIES      Family History  Problem Relation Age of Onset  . Depression Mother   . Stroke Mother   . Hypertension Mother   . Depression Sister   .  Heart murmur Sister   . Hypertension Father     Allergies  Allergen Reactions  . Sulfa Antibiotics Other (See Comments), Palpitations and Rash    Syncope Syncope Rapid heartrate  . Codeine Rash  . Sudafed [Pseudoephedrine] Rash    Current Outpatient Prescriptions on File Prior to Visit  Medication Sig Dispense Refill  . ALPRAZolam (XANAX) 0.25 MG tablet Take 0.25 mg by mouth daily as needed for anxiety.     Marland Kitchen amitriptyline (ELAVIL) 10 MG tablet Take 10 mg by mouth at bedtime.    Marland Kitchen azithromycin (ZITHROMAX) 250 MG tablet Take 2 tablets by mouth on day 1, followed by 1 tablet by mouth daily for 4 days. 6 tablet 0  . baclofen (LIORESAL) 10 MG tablet Take 1 tablet (10 mg total) by mouth 2 (two) times daily. Reported on 06/09/2015 60 each 00  . diltiazem (CARDIZEM) 30 MG tablet Take 30 mg by mouth daily as needed (heart rate).     Marland Kitchen estradiol (VIVELLE-DOT) 0.0375 MG/24HR Place 1 patch onto the skin once a week.    Marland Kitchen FLUoxetine (PROZAC) 20 MG tablet Take 20 mg by mouth daily.    . fluticasone (FLONASE) 50 MCG/ACT nasal spray Place 2 sprays into both nostrils daily.    . hydrOXYzine (ATARAX/VISTARIL) 25 MG tablet Take 25 mg by mouth 3 (three) times daily as needed (severe anxiety or panic attack).    . meloxicam (MOBIC) 7.5 MG tablet Take 7.5-15 mg by mouth daily.    Marland Kitchen omeprazole (PRILOSEC) 20 MG capsule Take 1 capsule (20 mg total) by mouth 2 (two) times daily before a meal. 60 capsule 3  . progesterone (PROMETRIUM) 100 MG capsule Take 100 mg by mouth daily.    Marland Kitchen tobramycin (TOBREX) 0.3 % ophthalmic solution Place 2 drops into the left eye  every 6 (six) hours. 5 mL 0   No current facility-administered medications on file prior to visit.     BP 120/88   Pulse 81   Temp 98.4 F (36.9 C) (Oral)   Resp 16   Ht 5\' 4"  (1.626 m)   Wt 128 lb 12.8 oz (58.4 kg)   LMP 09/29/2016 (Exact Date)   SpO2 100%   BMI 22.11 kg/m       Objective:   Physical Exam  General Mental Status- Alert. General Appearance- Not in acute distress.   Skin General: Color- Normal Color. Moisture- Normal Moisture.  Neck Carotid Arteries- Normal color. Moisture- Normal Moisture. No carotid bruits. No JVD.  Chest and Lung Exam Auscultation: Breath Sounds:-Normal.  Cardiovascular Auscultation:Rythm- Regular. Murmurs & Other Heart Sounds:Auscultation of the heart reveals- No Murmurs.  Abdomen Inspection:-Inspeection Normal. Palpation/Percussion:Note:No mass. Palpation and Percussion of the abdomen reveal- Non Tender, Non Distended + BS, no rebound or guarding.  Neurologic Cranial Nerve exam:- CN III-XII intact(No nystagmus), symmetric smile. Strength:- 5/5 equal and symmetric strength both upper and lower extremities.   HEENT Head- Normal. Ear Auditory Canal - Left- Normal. Right - Normal.Tympanic Membrane- Left- Normal. Right- Normal. Eye Sclera/Conjunctiva- Left- Normal. Right- Normal. Nose & Sinuses Nasal Mucosa- Left-  Not Boggy and Congested. Right- Not  Boggy and  Congested.Bilateral no  maxillary and no  frontal sinus pressure. Mouth & Throat Lips: Upper Lip- Normal: no dryness, cracking, pallor, cyanosis, or vesicular eruption. Lower Lip-Normal: no dryness, cracking, pallor, cyanosis or vesicular eruption. Buccal Mucosa- Bilateral- No Aphthous ulcers. Oropharynx- No Discharge or Erythema. Tonsils: Characteristics- Bilateral- No Erythema or Congestion. Size/Enlargement- Bilateral- No enlargement. Discharge- bilateral-None.  Lymphatic  Head & Neck General Head & Neck Lymphatics: Bilateral: Description- normal except faint  minimal swollen rt submandibular node.       Assessment & Plan:  Your throat pain is much improved with mildly inflamed/tender right submandibular node. Your rapid strep test was negative and we will send out throat culture as well to make sure no persisting infection. I think your symptoms are within normal variant. If your symptoms worsen or change pending send out throat culture result please let us know.  For your anxiety I am prescribing clonazepam. Use half a tablet for severe anxiety or panic attacks. If 30 minutes after the half tab doses, you still have anxiety can take second half tablet.  For depression and described on unsettled sleep, I am prescribing trazodone. This is different medication then he SSRIs which caused side effects.  Follow-up in 2-3 weeks or as needed.  Maritta Kief, Percell Miller, PA-C

## 2016-10-09 LAB — CULTURE, GROUP A STREP
MICRO NUMBER: 80986051
SPECIMEN QUALITY: ADEQUATE

## 2016-10-11 ENCOUNTER — Emergency Department (HOSPITAL_BASED_OUTPATIENT_CLINIC_OR_DEPARTMENT_OTHER)
Admission: EM | Admit: 2016-10-11 | Discharge: 2016-10-11 | Disposition: A | Discharge status: Home / Self Care | Payer: No Typology Code available for payment source | Attending: Emergency Medicine | Admitting: Emergency Medicine

## 2016-10-11 ENCOUNTER — Emergency Department (HOSPITAL_BASED_OUTPATIENT_CLINIC_OR_DEPARTMENT_OTHER): Payer: No Typology Code available for payment source

## 2016-10-11 ENCOUNTER — Encounter (HOSPITAL_BASED_OUTPATIENT_CLINIC_OR_DEPARTMENT_OTHER): Payer: Self-pay | Admitting: Emergency Medicine

## 2016-10-11 ENCOUNTER — Telehealth: Payer: Self-pay | Admitting: Medical

## 2016-10-11 DIAGNOSIS — J45909 Unspecified asthma, uncomplicated: Secondary | ICD-10-CM | POA: Insufficient documentation

## 2016-10-11 DIAGNOSIS — R0602 Shortness of breath: Secondary | ICD-10-CM

## 2016-10-11 DIAGNOSIS — R0789 Other chest pain: Secondary | ICD-10-CM | POA: Diagnosis not present

## 2016-10-11 DIAGNOSIS — Z79899 Other long term (current) drug therapy: Secondary | ICD-10-CM | POA: Diagnosis not present

## 2016-10-11 DIAGNOSIS — R079 Chest pain, unspecified: Secondary | ICD-10-CM | POA: Diagnosis present

## 2016-10-11 HISTORY — DX: Unspecified asthma, uncomplicated: J45.909

## 2016-10-11 LAB — COMPREHENSIVE METABOLIC PANEL
ALBUMIN: 4.1 g/dL (ref 3.5–5.0)
ALK PHOS: 69 U/L (ref 38–126)
ALT: 11 U/L — AB (ref 14–54)
AST: 17 U/L (ref 15–41)
Anion gap: 9 (ref 5–15)
BILIRUBIN TOTAL: 0.6 mg/dL (ref 0.3–1.2)
BUN: 9 mg/dL (ref 6–20)
CALCIUM: 9.1 mg/dL (ref 8.9–10.3)
CO2: 26 mmol/L (ref 22–32)
CREATININE: 0.62 mg/dL (ref 0.44–1.00)
Chloride: 102 mmol/L (ref 101–111)
GFR calc Af Amer: >60 mL/min (ref >60)
GLUCOSE: 97 mg/dL (ref 65–99)
Potassium: 3.3 mmol/L — ABNORMAL LOW (ref 3.5–5.1)
Sodium: 137 mmol/L (ref 135–145)
TOTAL PROTEIN: 7.7 g/dL (ref 6.5–8.1)

## 2016-10-11 LAB — CBC
HEMATOCRIT: 35.2 % — AB (ref 36.0–46.0)
HEMOGLOBIN: 11.8 g/dL — AB (ref 12.0–15.0)
MCH: 26.2 pg (ref 26.0–34.0)
MCHC: 33.5 g/dL (ref 30.0–36.0)
MCV: 78 fL (ref 78.0–100.0)
Platelets: 383 10*3/uL (ref 150–400)
RBC: 4.51 MIL/uL (ref 3.87–5.11)
RDW: 17 % — ABNORMAL HIGH (ref 11.5–15.5)
WBC: 9.6 10*3/uL (ref 4.0–10.5)

## 2016-10-11 LAB — TROPONIN I: Troponin I: <0.03 ng/mL (ref <0.03)

## 2016-10-11 LAB — D-DIMER, QUANTITATIVE: D-Dimer, Quant: 0.3 ug/mL-FEU (ref 0.00–0.50)

## 2016-10-11 NOTE — ED Notes (Signed)
Pt states pain increases in chest on deep inhalation.

## 2016-10-11 NOTE — ED Provider Notes (Signed)
Christine Rosales, Christine Rosales may be related to taking this medication.   HPI Christine Rosales is a 41 y.o. female.  The history is provided by the patient.  Chest Pain   This is a new problem. The current episode started less than 1 hour ago. The problem occurs constantly. The problem has been gradually improving. The pain is associated with rest. The pain is present in the substernal region. The pain is moderate. The quality of the pain is described as burning and pressure-like. The pain does not radiate. Associated Rosales include cough, palpitations and shortness of breath. Pertinent negatives include no back pain, no irregular heartbeat, no leg pain, no lower extremity edema, no malaise/fatigue and no nausea. Risk factors include oral contraceptive use.  Pertinent negatives for past medical history include no diabetes, no DVT, no hyperlipidemia, no hypertension, no MI, no PE, no strokes and no TIA.   Recent travel to [place] last month, on OCPs.   Past Medical History:  Diagnosis Date  . Anxiety   . Asthma   . Fibromyalgia   . Headache   . Palpitations   . TMJ (dislocation of temporomandibular joint)     Patient Active Problem List   Diagnosis Date Noted  . GAD (generalized anxiety disorder) 04/07/2015  . Panic disorder without agoraphobia 04/07/2015  . Anxiety state 02/27/2015  . Palpitation 02/27/2015  . Fibromyalgia 02/27/2015  . 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 NO PAST SURGERIES      OB History    No  data available       Home Medications    Prior to Admission medications   Medication Sig Start Date End Date Taking? Authorizing Provider  baclofen (LIORESAL) 10 MG tablet Take 1 tablet (10 mg total) by mouth 2 (two) times daily. Reported on 06/09/2015 09/22/16   Saguier, Percell Miller, PA-C  clonazePAM Bobbye [place]) 0.5 MG tablet 1/2-1 tab po q day as needed anxiety/panic attack 10/07/16   Saguier, Percell Miller, PA-C  diltiazem (CARDIZEM) 30 MG tablet Take 30 mg by mouth daily as needed (heart rate).     [provider]  estradiol (VIVELLE-DOT) 0.0375 MG/24HR Place 1 patch onto the skin once a week.    [provider]  fluticasone (FLONASE) 50 MCG/ACT nasal spray Place 2 sprays into both nostrils daily. 05/25/16   [provider]  hydrOXYzine (ATARAX/VISTARIL) 25 MG tablet Take 25 mg by mouth 3 (three) times daily as needed (severe anxiety or panic attack).    [provider]  meloxicam (MOBIC) 7.5 MG tablet Take 7.5-15 mg by mouth daily.    [provider]  omeprazole (PRILOSEC) 20 MG capsule Take 1 capsule (20 mg total) by mouth 2 (two) times daily before a meal. 03/02/15   Saguier, Percell Miller, PA-C  progesterone (PROMETRIUM) 100 MG capsule Take 100 mg by mouth daily.    [provider]  tobramycin (TOBREX) 0.3 % ophthalmic solution Place 2 drops into the left eye every 6 (six) hours. 09/22/16   Saguier, Percell Miller,  PA-C  traZODone (DESYREL) 50 MG tablet Take 0.5-1 tablets (25-50 mg total) by mouth at bedtime as needed for sleep. 10/07/16   Saguier, Percell Miller, PA-C    Family History Family History  Problem Relation Age of Onset  . Depression Mother   . Stroke Mother   . Hypertension Mother   . Depression Sister   . Heart murmur Sister   . Hypertension Father     Social History Social History  Substance Use Topics  . Smoking status: Never Smoker  . Smokeless tobacco: Never Used  . Alcohol use No     Allergies   Sulfa antibiotics; Codeine; and Sudafed  [pseudoephedrine]   Review of Systems Review of Systems  Constitutional: Negative for malaise/fatigue.  Respiratory: Positive for cough and shortness of breath.   Cardiovascular: Positive for chest pain and palpitations.  Gastrointestinal: Negative for nausea.  Musculoskeletal: Negative for back pain.  All other systems are reviewed and are negative for acute change except as noted in the HPI    Physical Exam Updated Vital Signs BP (!) 139/101 (BP Location: Right Arm)   Pulse 96   Temp 97.9 F (36.6 C) (Oral)   Resp 13   Ht 5\' 4"  (1.626 m)   Wt 59 kg (130 lb)   LMP 09/29/2016 (Exact Date)   SpO2 100%   BMI 22.31 kg/m   Physical Exam  Constitutional: She is oriented to person, place, and time. She appears well-developed and well-nourished. No distress.  HENT:  Head: Normocephalic and atraumatic.  Nose: Nose normal.  Eyes: Pupils are equal, round, and reactive to light. Conjunctivae and EOM are normal. Right eye exhibits no discharge. Left eye exhibits no discharge. No scleral icterus.  Neck: Normal range of motion. Neck supple.  Cardiovascular: Regular rhythm.  Tachycardia present.  Exam reveals no gallop and no friction rub.   No murmur heard. Pulmonary/Chest: Effort normal and breath sounds normal. No stridor. No respiratory distress. She has no rales.  Abdominal: Soft. She exhibits no distension. There is no tenderness.  Musculoskeletal: She exhibits no edema or tenderness.  Neurological: She is alert and oriented to person, place, and time.  Skin: Skin is warm and dry. No rash noted. She is not diaphoretic. No erythema.  Psychiatric: She has a normal mood and affect.  Vitals reviewed.    ED Treatments / Results  Labs (all labs ordered are listed, but only abnormal results are displayed) Labs Reviewed  CBC - Abnormal; Notable for the following:       Result Value   Hemoglobin 11.8 (*)    HCT 35.2 (*)    RDW 17.0 (*)    All other components within normal  limits  COMPREHENSIVE METABOLIC PANEL - Abnormal; Notable for the following:    Potassium 3.3 (*)    ALT 11 (*)    All other components within normal limits  TROPONIN I  D-DIMER, QUANTITATIVE (NOT AT [facility])  TROPONIN I    EKG  EKG Interpretation  Date/Time:  Tuesday October 11 2016 17:24:30 EDT Ventricular Rate:  86 PR Interval:    QRS Duration: 97 QT Interval:  377 QTC Calculation: 451 R Axis:   59 Text Interpretation:  Sinus rhythm NO STEMI Confirmed by Addison Lank [phone]) on 10/11/2016 6:33:20 PM       Radiology Dg Chest 2 View  Result Date: 10/11/2016 CLINICAL DATA:  Chest pain and pressure in the midchest beginning 30 minutes prior to admission. History of asthma and palpitations. EXAM: CHEST  2 VIEW COMPARISON:  PA and lateral chest x-ray of Jun 05, 2016 FINDINGS: The lungs are well-expanded. There is no pneumothorax, pneumomediastinum, or pleural effusion. The retrosternal soft tissues are normal. There is no interstitial or alveolar infiltrate. The heart and pulmonary vascularity are normal. The bony thorax is unremarkable. IMPRESSION: There is no active cardiopulmonary disease. Electronically Signed   By: David  Martinique M.D.   On: 10/11/2016 17:10    Procedures Procedures (including critical care time)  Medications Ordered in ED Medications - No data to display   Initial Impression / Assessment and Plan / ED Course  I have reviewed the triage vital signs and the nursing notes.  Pertinent labs & imaging results that were available during my care of the patient were reviewed by me and considered in my medical decision making (see chart for details).     Atypical chest pain. EKG without acute ischemic changes or evidence of pericarditis.HEAR <4. Troponin negative 2 to 3 hour interval. Feel is sufficient to rule out ACS.  Presentation a classic for aortic dissection or esophageal perforation.  Chest x-ray without evidence suggestive of pneumonia, pneumothorax,  pneumomediastinum.  No abnormal contour of the mediastinum to suggest dissection. No evidence of acute injuries.  Low pretest probability for pulmonary embolism but unable to [facility] Christine to tachycardia, OCPs, and recent travel. D-dimer negative.  There is a component of anxiety; which may be the cause of her symptomatology. The patient has significantly improved during her ED stay.   The patient is safe for discharge with strict return precautions.  Final Clinical Impressions(s) / ED Diagnoses   Final diagnoses:  Atypical chest pain  SOB (shortness of breath)   Disposition: Discharge  Condition: Good  I have discussed the results, Dx and Tx plan with the patient who expressed understanding and agree(s) with the plan. Discharge instructions discussed at great length. The patient was given strict return precautions who verbalized understanding of the instructions. No further questions at time of discharge.    New Prescriptions   No medications on file    Follow Up: Elise [facility] [place] [address] [phone]  Schedule an appointment as soon as possible for a visit  As needed      Fatima Blank, MD 10/11/16 2135

## 2016-10-11 NOTE — Telephone Encounter (Signed)
-----   Message from Mackie Pai, PA-C sent at 10/09/2016  6:43 PM EDT ----- The send out throat culture was negative for strep. Notify pt.

## 2016-10-11 NOTE — ED Notes (Signed)
Patient is A & O x4.  She understood discharge instructions. 

## 2016-10-11 NOTE — Telephone Encounter (Signed)
Pt was already notify.

## 2016-10-11 NOTE — ED Triage Notes (Signed)
Chest tightness after using inhaler   States b/p was 200/110 at home

## 2016-10-11 NOTE — ED Notes (Signed)
ED Provider at bedside. 

## 2016-10-21 ENCOUNTER — Ambulatory Visit: Payer: No Typology Code available for payment source | Admitting: Internal Medicine

## 2016-10-25 ENCOUNTER — Emergency Department (HOSPITAL_BASED_OUTPATIENT_CLINIC_OR_DEPARTMENT_OTHER)
Admission: EM | Admit: 2016-10-25 | Discharge: 2016-10-25 | Disposition: A | Discharge status: Home / Self Care | Payer: No Typology Code available for payment source | Attending: Emergency Medicine | Admitting: Emergency Medicine

## 2016-10-25 ENCOUNTER — Ambulatory Visit (INDEPENDENT_AMBULATORY_CARE_PROVIDER_SITE_OTHER): Payer: No Typology Code available for payment source | Admitting: Medical

## 2016-10-25 ENCOUNTER — Other Ambulatory Visit (HOSPITAL_BASED_OUTPATIENT_CLINIC_OR_DEPARTMENT_OTHER): Payer: No Typology Code available for payment source

## 2016-10-25 ENCOUNTER — Other Ambulatory Visit: Payer: Self-pay

## 2016-10-25 ENCOUNTER — Emergency Department (HOSPITAL_BASED_OUTPATIENT_CLINIC_OR_DEPARTMENT_OTHER): Payer: No Typology Code available for payment source

## 2016-10-25 ENCOUNTER — Encounter (HOSPITAL_BASED_OUTPATIENT_CLINIC_OR_DEPARTMENT_OTHER): Payer: Self-pay | Admitting: *Deleted

## 2016-10-25 ENCOUNTER — Telehealth: Payer: Self-pay | Admitting: Medical

## 2016-10-25 ENCOUNTER — Encounter: Payer: Self-pay | Admitting: Medical

## 2016-10-25 ENCOUNTER — Ambulatory Visit (HOSPITAL_BASED_OUTPATIENT_CLINIC_OR_DEPARTMENT_OTHER)
Admission: RE | Admit: 2016-10-25 | Discharge: 2016-10-25 | Disposition: A | Discharge status: Home / Self Care | Payer: No Typology Code available for payment source | Source: Ambulatory Visit | Attending: Medical | Admitting: Medical

## 2016-10-25 VITALS — BP 120/88 | HR 79 | Temp 97.9°F | Resp 16 | Ht 64.0 in | Wt 132.0 lb

## 2016-10-25 DIAGNOSIS — R0602 Shortness of breath: Secondary | ICD-10-CM | POA: Diagnosis present

## 2016-10-25 DIAGNOSIS — Z79899 Other long term (current) drug therapy: Secondary | ICD-10-CM | POA: Diagnosis not present

## 2016-10-25 DIAGNOSIS — M546 Pain in thoracic spine: Secondary | ICD-10-CM

## 2016-10-25 DIAGNOSIS — R0781 Pleurodynia: Secondary | ICD-10-CM

## 2016-10-25 DIAGNOSIS — J45909 Unspecified asthma, uncomplicated: Secondary | ICD-10-CM | POA: Diagnosis not present

## 2016-10-25 DIAGNOSIS — M549 Dorsalgia, unspecified: Secondary | ICD-10-CM | POA: Insufficient documentation

## 2016-10-25 DIAGNOSIS — R7989 Other specified abnormal findings of blood chemistry: Secondary | ICD-10-CM

## 2016-10-25 LAB — BASIC METABOLIC PANEL
ANION GAP: 7 (ref 5–15)
BUN: 14 mg/dL (ref 6–20)
CHLORIDE: 104 mmol/L (ref 101–111)
CO2: 25 mmol/L (ref 22–32)
Calcium: 8.8 mg/dL — ABNORMAL LOW (ref 8.9–10.3)
Creatinine, Ser: 0.76 mg/dL (ref 0.44–1.00)
GFR calc Af Amer: >60 mL/min (ref >60)
GLUCOSE: 100 mg/dL — AB (ref 65–99)
POTASSIUM: 3.8 mmol/L (ref 3.5–5.1)
SODIUM: 136 mmol/L (ref 135–145)

## 2016-10-25 LAB — CBC
HCT: 31.7 % — ABNORMAL LOW (ref 36.0–46.0)
HEMOGLOBIN: 10.7 g/dL — AB (ref 12.0–15.0)
MCH: 27 pg (ref 26.0–34.0)
MCHC: 33.8 g/dL (ref 30.0–36.0)
MCV: 79.8 fL (ref 78.0–100.0)
PLATELETS: 353 10*3/uL (ref 150–400)
RBC: 3.97 MIL/uL (ref 3.87–5.11)
RDW: 16.4 % — ABNORMAL HIGH (ref 11.5–15.5)
WBC: 6.7 10*3/uL (ref 4.0–10.5)

## 2016-10-25 LAB — PREGNANCY, URINE: Preg Test, Ur: NEGATIVE

## 2016-10-25 LAB — D-DIMER, QUANTITATIVE: D-Dimer, Quant: 2.9 mcg/mL FEU — ABNORMAL HIGH (ref <0.50)

## 2016-10-25 LAB — TROPONIN I

## 2016-10-25 MED ORDER — IOPAMIDOL (ISOVUE-370) INJECTION 76%
100.0000 mL | Freq: Once | INTRAVENOUS | Status: AC | PRN
  Administered 2016-10-25: 100 mL via INTRAVENOUS

## 2016-10-25 MED ORDER — KETOROLAC TROMETHAMINE 60 MG/2ML IM SOLN
60.0000 mg | Freq: Once | INTRAMUSCULAR | Status: AC
  Administered 2016-10-25: 60 mg via INTRAMUSCULAR

## 2016-10-25 MED ORDER — FAMCICLOVIR 500 MG PO TABS
500.0000 mg | ORAL_TABLET | Freq: Three times a day (TID) | ORAL | 0 refills | Status: DC
Start: 2016-10-25 — End: 2017-09-11

## 2016-10-25 NOTE — Discharge Instructions (Signed)
Your CT scan was negative for clot in your lung. Your EKG and troponin levels do not show evidence of heart attack. Your electrolyte levels are normal.   I suspect that the pain you are experiencing is musculoskeletal. You may use ibuprofen and apply heat to the upper back.  Please schedule an appointment with your primary care doctor if your left upper back pain continues.  Please return to the emergency department if he developed chest pain with shortness of breath, worsening shortness of breath or new or worsening symptoms

## 2016-10-25 NOTE — ED Notes (Signed)
Pt on monitor 

## 2016-10-25 NOTE — Patient Instructions (Signed)
Your left upper thorax/back region pain appears to be more neuropathic like. Area is very sensitive to light touch. I do have concern for possible early shingles. Presently we gave you Toradol 60 mg IM injection. I want you not to take any prescription or over-the-counter anti-inflammatory for the next 24 hours. You can use salon pas over-the-counter lidocaine patch to the area. Also will go ahead and write Famvir antiviral medication to start today.  Since shingles is not definitively diagnosed yet, I do want to do a chest x-ray to evaluate other potential diagnoses and do a d-dimer test. If the D-dimer test is elevated then would order CT of the chest.  Follow-up in 3-5 days or as needed.

## 2016-10-25 NOTE — ED Triage Notes (Addendum)
She was seen by her MD upstairs for pain in her left lung. She had blood drawn and after she got home she was called and told to come to the ED for possible PE. States she was checked 2 weeks ago for PE and it was negative.

## 2016-10-25 NOTE — ED Notes (Signed)
Received TC from [facility].  Pt was told to have outpatient CT done.  Was scheduled and authorized.  Pt misunderstood PA.  He had related that if the office could not get authorization early, to follow up in ED.  Pt came to ED for evaluation.  [facility] canceled prior CT scan that was ordered and stated to allow the EDP see the patient instead.

## 2016-10-25 NOTE — Telephone Encounter (Signed)
Pt has left side thorax pain with elevated d-dimer. Pt in ct chest angio. Can you get that authorized. If having problems please let me know. Need to now stat.

## 2016-10-25 NOTE — ED Provider Notes (Signed)
[facility] Provider Note   CSN: [id number] Arrival date & time: 10/25/16  1416     History   Chief Complaint Chief Complaint  Patient presents with  . Shortness of Breath    HPI Christine Rosales is a 41 y.o. female.  HPI   Christine Rosales is a 41 year old female with a history of anxiety, panic disorder, fibromyalgia who presents to the emergency department after being told by her primary care physician that she needed to be worked up for potential pulmonary embolus in the ED. Patient states that she overnight she developed back pain in her upper left thoracic area which radiates to her left lateral chest wall. She states the pain feels like a "squeezing, contracting" sensation in her thorax which is worsened when she takes a deep breath. Her pain is also worsened when she walks, or leans towards the left side. Currently pain is at a 6/10 in severity, previously 8/10 in severity when she was at her primary care office. She states that she was given a diclofenac shot in the office which has improved her pain some. Patient endorses some mild shortness of breath. She denies chest pain, palpitations, cough, wheezing, hemoptysis, leg swelling, N/V, abdominal pain. She denies history of clot in the past. She states that she was seen by gynecology about a month ago who told her that she had premature ovarian failure and started her on low dose PO estrogen and progesterone, and also a patch. She states that her mother has a history of stroke and an IVC filter, and that her maternal aunt has a history of recurrent clots. She has never been told that she has a clotting disorder. She denies cancer history, sitting stationary for long periods, recent surgery or trauma.   Past Medical History:  Diagnosis Date  . Anxiety   . Asthma   . Fibromyalgia   . Headache   . Palpitations   . TMJ (dislocation of temporomandibular joint)     Patient Active Problem List   Diagnosis Date Noted    . GAD (generalized anxiety disorder) 04/07/2015  . Panic disorder without agoraphobia 04/07/2015  . Anxiety state 02/27/2015  . Palpitation 02/27/2015  . Fibromyalgia 02/27/2015  . 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 NO PAST SURGERIES      OB History    No data available       Home Medications    Prior to Admission medications   Medication Sig Start Date End Date Taking? Authorizing Provider  ALPRAZolam (XANAX) 0.25 MG tablet Take 0.25 mg by mouth. 10/17/16   [provider]  baclofen (LIORESAL) 10 MG tablet Take 1 tablet (10 mg total) by mouth 2 (two) times daily. Reported on 06/09/2015 09/22/16   Saguier, Percell Miller, PA-C  clonazePAM Bobbye [place]) 0.5 MG tablet 1/2-1 tab po q day as needed anxiety/panic attack 10/07/16   Saguier, Percell Miller, PA-C  diltiazem (CARDIZEM) 30 MG tablet Take 30 mg by mouth daily as needed (heart rate).     [provider]  estradiol (VIVELLE-DOT) 0.0375 MG/24HR Place 1 patch onto the skin once a week.    [provider]  famciclovir (FAMVIR) 500 MG tablet Take 1 tablet (500 mg total) by mouth 3 (three) times daily. 10/25/16   Saguier, Percell Miller, PA-C  fluticasone (FLONASE) 50 MCG/ACT nasal spray Place 2 sprays into both nostrils daily. 05/25/16   [provider]  hydrOXYzine (ATARAX/VISTARIL) 25 MG tablet Take 25 mg  by mouth 3 (three) times daily as needed (severe anxiety or panic attack).    [provider]  meloxicam (MOBIC) 7.5 MG tablet Take 7.5-15 mg by mouth daily.    [provider]  omeprazole (PRILOSEC) 20 MG capsule Take 1 capsule (20 mg total) by mouth 2 (two) times daily before a meal. 03/02/15   Saguier, Percell Miller, PA-C  progesterone (PROMETRIUM) 100 MG capsule Take 100 mg by mouth daily.    [provider]  tobramycin (TOBREX) 0.3 % ophthalmic solution Place 2 drops into the left eye every 6 (six) hours. 09/22/16   Saguier, Percell Miller, PA-C  traZODone (DESYREL)  50 MG tablet Take 0.5-1 tablets (25-50 mg total) by mouth at bedtime as needed for sleep. 10/07/16   Saguier, Percell Miller, PA-C    Family History Family History  Problem Relation Age of Onset  . Depression Mother   . Stroke Mother   . Hypertension Mother   . Depression Sister   . Heart murmur Sister   . Hypertension Father     Social History Social History  Substance Use Topics  . Smoking status: Never Smoker  . Smokeless tobacco: Never Used  . Alcohol use No     Allergies   Sulfa antibiotics; Codeine; and Sudafed [pseudoephedrine]   Review of Systems Review of Systems  Constitutional: Negative for chills and fever.  Eyes: Negative for visual disturbance.  Respiratory: Positive for shortness of breath. Negative for cough, chest tightness and wheezing.   Cardiovascular: Negative for chest pain, palpitations and leg swelling.  Gastrointestinal: Negative for abdominal pain, diarrhea, nausea and vomiting.  Genitourinary: Negative for difficulty urinating and dysuria.  Musculoskeletal: Positive for back pain (pain over her left thoracic area of her back which radiates to her lateral rib cage ). Negative for gait problem, neck pain and neck stiffness.  Skin: Negative for rash and wound.  Neurological: Negative for weakness, light-headedness, numbness and headaches.  Psychiatric/Behavioral: Negative for agitation.     Physical Exam Updated Vital Signs BP (!) 154/97 (BP Location: Left Arm)   Pulse 96   Temp 98.8 F (37.1 C) (Oral)   Resp 16   Ht 5\' 4"  (1.626 m)   Wt 59.9 kg (132 lb)   LMP 10/11/2016   SpO2 100%   BMI 22.66 kg/m   Physical Exam  Constitutional: She is oriented to person, place, and time. She appears well-developed and well-nourished. No distress.  HENT:  Head: Normocephalic and atraumatic.  Mouth/Throat: Oropharynx is clear and moist. No oropharyngeal exudate.  Eyes: Pupils are equal, round, and reactive to light. Conjunctivae are normal. Right eye  exhibits no discharge. Left eye exhibits no discharge.  Neck: Normal range of motion. Neck supple.  Cardiovascular: Normal rate, regular rhythm and intact distal pulses.  Exam reveals no friction rub.   No murmur heard. Pulmonary/Chest: Effort normal. No respiratory distress. She has no wheezes. She has no rales. She exhibits tenderness (tenderness to palpation over upper left back and lateral rib cage on the left side).  Abdominal: Soft. Bowel sounds are normal. There is no tenderness. There is no guarding.  Musculoskeletal: Normal range of motion. She exhibits no tenderness.  No unilateral leg swelling. No leg pain.   Neurological: She is alert and oriented to person, place, and time. Coordination normal.  Skin: Skin is warm and dry. Capillary refill takes less than 2 seconds. No rash noted. She is not diaphoretic.  Psychiatric: She has a normal mood and affect. Her behavior is normal.  Nursing note and vitals reviewed.    ED Treatments / Results  Labs (all labs ordered are listed, but only abnormal results are displayed) Labs Reviewed  CBC - Abnormal; Notable for the following:       Result Value   Hemoglobin 10.7 (*)    HCT 31.7 (*)    RDW 16.4 (*)    All other components within normal limits  BASIC METABOLIC PANEL - Abnormal; Notable for the following:    Glucose, Bld 100 (*)    Calcium 8.8 (*)    All other components within normal limits  TROPONIN I  PREGNANCY, URINE    EKG  EKG Interpretation None       Radiology Dg Chest 2 View  Result Date: 10/25/2016 CLINICAL DATA:  Chronic left side rib pain.  No injury. EXAM: CHEST  2 VIEW COMPARISON:  10/11/2016 FINDINGS: Heart and mediastinal contours are within normal limits. No focal opacities or effusions. No acute bony abnormality. IMPRESSION: No active cardiopulmonary disease. Electronically Signed   By: Rolm Baptise M.D.   On: 10/25/2016 10:47    Procedures Procedures (including critical care time)  Medications  Ordered in ED Medications - No data to display   Initial Impression / Assessment and Plan / ED Course  I have reviewed the triage vital signs and the nursing notes.  Pertinent labs & imaging results that were available during my care of the patient were reviewed by me and considered in my medical decision making (see chart for details).     CT angiogram negative for acute PE.  Do not suspect ACS given patient does not have chest pain, N/V, diaphoresis, has no history of exertional chest pain or ischemic heart disease and her troponin is negative, EKG nonischemic.   Patient is mildly anemic (Hemoglobin 10.7), she has been counseled to get this evaluated at her primary care office. Do not suspect that this is related to her upper back pain complaint today.   Suspect that patient's upper back pain is musculoskeletal in nature, as she has pain with even soft touch of that area and also has worsening pain with movement on the left side. Have counseled her that she may use NSAIDs for pain and heat over the area. Discussed return precautions and patient voices understanding and agrees to discharge. Discussed this patient with Dr. Gilford Raid who agrees to discharge.   Final Clinical Impressions(s) / ED Diagnoses   Final diagnoses:  None    New Prescriptions New Prescriptions   No medications on file     Bernarda Caffey 10/25/16 1913    Isla Pence, MD 10/25/16 2326

## 2016-10-25 NOTE — Progress Notes (Signed)
Subjective:    Patient ID: Christine Rosales, female    DOB: 1975/03/30, 41 y.o.   MRN: [id number]  HPI  Pt in for some pain in left upper back pain from area of scapula region toward upper cva area. Pt states pain started on Sunday. No fall or trauma. Pt skin sensitive to touch. Pt has no rash on skin. No vesicle eruption. Pt in 2016 got chicken pox. Pt has no recent fall or trauma that started this pain. Pt does have history of fibromyalgia.(At one point during the exam patient described sensation in the area has a balloon blowing up) She also described the pain as severe.  Pt states when leans forward will relieve pain.  Pt tried mobic, advil, and baclofen. Did not stop back region pain.   Pt updates that trazadone caused her hallucinations, tachycardia and elevated. In past pt had various reaction to depression medications.  No cva pain. No urinary symptoms.  LMP- 2 weeks ago      Review of Systems  Constitutional: Negative for chills, fatigue and fever.  Respiratory: Negative for cough, chest tightness, shortness of breath and wheezing.   Cardiovascular: Negative for chest pain and palpitations.  Gastrointestinal: Negative for abdominal distention, abdominal pain and anal bleeding.  Genitourinary: Negative for difficulty urinating, dysuria, frequency, hematuria, urgency, vaginal bleeding, vaginal discharge and vaginal pain.  Musculoskeletal: Positive for back pain.       See back pain.  Skin: Negative for rash.  Neurological: Negative for dizziness, speech difficulty, weakness, numbness and headaches.  Hematological: Negative for adenopathy. Does not bruise/bleed easily.  Psychiatric/Behavioral: Negative for agitation, behavioral problems, decreased concentration and dysphoric mood. The patient is nervous/anxious.     Past Medical History:  Diagnosis Date  . Anxiety   . Asthma   . Fibromyalgia   . Headache   . Palpitations   . TMJ (dislocation of temporomandibular  joint)      Social History   Social History  . Marital status: Married    Spouse name: N/A  . Number of children: N/A  . Years of education: N/A   Occupational History  . Not on file.   Social History Main Topics  . Smoking status: Never Smoker  . Smokeless tobacco: Never Used  . Alcohol use No  . Drug use: No  . Sexual activity: Yes    Birth control/ protection: Surgical, Other-see comments     Comment: husband has vascetomy   Other Topics Concern  . Not on file   Social History Narrative   Born and raised in [place] by parents. Pt has one older sister. Mom suffered from depression and it affected how she raised the pt. Pt has a college degree in Associate Emergency Medicine. Pt moved to [place] in 2009 due to her husband's job. Pt has bee married 14 yrs and has 2 kids. 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     Past Surgical History:  Procedure Laterality Date  . NO PAST SURGERIES      Family History  Problem Relation Age of Onset  . Depression Mother   . Stroke Mother   . Hypertension Mother   . Depression Sister   . Heart murmur Sister   . Hypertension Father     Allergies  Allergen Reactions  . Sulfa Antibiotics Other (See Comments), Palpitations and Rash    Syncope Syncope Rapid heartrate  . Codeine Rash  . Sudafed [Pseudoephedrine] Rash  Current Outpatient Prescriptions on File Prior to Visit  Medication Sig Dispense Refill  . baclofen (LIORESAL) 10 MG tablet Take 1 tablet (10 mg total) by mouth 2 (two) times daily. Reported on 06/09/2015 60 each 00  . clonazePAM (KLONOPIN) 0.5 MG tablet 1/2-1 tab po q day as needed anxiety/panic attack 14 tablet 0  . diltiazem (CARDIZEM) 30 MG tablet Take 30 mg by mouth daily as needed (heart rate).     Marland Kitchen estradiol (VIVELLE-DOT) 0.0375 MG/24HR Place 1 patch onto the skin once a week.    . fluticasone (FLONASE) 50 MCG/ACT nasal spray Place 2 sprays into both nostrils daily.    .  hydrOXYzine (ATARAX/VISTARIL) 25 MG tablet Take 25 mg by mouth 3 (three) times daily as needed (severe anxiety or panic attack).    . meloxicam (MOBIC) 7.5 MG tablet Take 7.5-15 mg by mouth daily.    Marland Kitchen omeprazole (PRILOSEC) 20 MG capsule Take 1 capsule (20 mg total) by mouth 2 (two) times daily before a meal. 60 capsule 3  . progesterone (PROMETRIUM) 100 MG capsule Take 100 mg by mouth daily.    Marland Kitchen tobramycin (TOBREX) 0.3 % ophthalmic solution Place 2 drops into the left eye every 6 (six) hours. 5 mL 0  . traZODone (DESYREL) 50 MG tablet Take 0.5-1 tablets (25-50 mg total) by mouth at bedtime as needed for sleep. 30 tablet 3   No current facility-administered medications on file prior to visit.     BP 120/88   Pulse 79   Temp 97.9 F (36.6 C) (Oral)   Resp 16   Ht 5\' 4"  (1.626 m)   Wt 132 lb (59.9 kg)   LMP 09/29/2016 (Exact Date)   SpO2 100%   BMI 22.66 kg/m       Objective:   Physical Exam  General Mental Status- Alert. General Appearance- Not in acute distress.   Skin General: Color- Normal Color. Moisture- Normal Moisture. No rash seen on exam. Skin sensitive to light touch.  Neck Carotid Arteries- Normal color. Moisture- Normal Moisture. No carotid bruits. No JVD.  Chest and Lung Exam Auscultation: Breath Sounds:-Normal.  Cardiovascular Auscultation:Rythm- Regular. Murmurs & Other Heart Sounds:Auscultation of the heart reveals- No Murmurs.  Abdomen Inspection:-Inspeection Normal. Palpation/Percussion:Note:No mass. Palpation and Percussion of the abdomen reveal- Non Tender, Non Distended + BS, no rebound or guarding.    Neurologic Cranial Nerve exam:- CN III-XII intact(No nystagmus), symmetric smile. Strength:- 5/5 equal and symmetric strength both upper and lower extremities.  Back- no cva area pain on palpation.  Left upper thorax- light touch severe pain. Region of upper thorax level of lower scapula. Pain extends toward spine.      Assessment & Plan:   Your left upper thorax/back region pain appears to be more neuropathic like. Area is very sensitive to light touch. I do have concern for possible early shingles. Presently we gave you Toradol 60 mg IM injection. I want you not to take any prescription or over-the-counter anti-inflammatory for the next 24 hours. You can use salon pas over-the-counter lidocaine patch to the area. Also will go ahead and write Famvir antiviral medication to start today.  Since shingles is not definitively diagnosed yet, I do want to do a chest x-ray to evaluate other potential diagnoses and do a d-dimer test. If the D-dimer test is elevated then would order CT of the chest.  Follow-up in 3-5 days or as needed.  Patient's d-dimer was elevated. Call her and notified her of this and that  we would try to get a CT of chest order/authorized. Explained that if there was major delay or it appears they were denying authorization then would advise being seen in the emergency department. It took some time to get the authorization and by the time we tried to notify patient she had are gone to the emergency department. Later staff notified me that she was already taken back to being evaluated. So advise referral staff that we would let emergency department work her up and ordered test.    Mackie Pai, PA-C

## 2016-10-27 ENCOUNTER — Telehealth: Payer: Self-pay | Admitting: *Deleted

## 2016-10-27 NOTE — Telephone Encounter (Signed)
Received results from [facility]; forwarded to provider/SLS 09/27

## 2016-11-04 ENCOUNTER — Encounter: Payer: Self-pay | Admitting: Internal Medicine

## 2016-11-04 ENCOUNTER — Ambulatory Visit (INDEPENDENT_AMBULATORY_CARE_PROVIDER_SITE_OTHER): Payer: No Typology Code available for payment source | Admitting: Internal Medicine

## 2016-11-04 VITALS — BP 130/90 | HR 100 | Ht 64.0 in | Wt 129.6 lb

## 2016-11-04 DIAGNOSIS — R002 Palpitations: Secondary | ICD-10-CM

## 2016-11-04 MED ORDER — ATENOLOL 25 MG PO TABS: ORAL_TABLET | ORAL | 2 refills | Status: DC

## 2016-11-04 NOTE — Progress Notes (Signed)
Follow-up Outpatient Visit Date: 11/04/2016  Primary Care Provider: Elise Benne [place] [id number]  Chief Complaint: Palpitations and chest pain  HPI:  [name] is a 41 y.o. year-old female with history of palpitations, anxiety, and fibromyalgia, who presents for follow-up of palpitations. History is obtained with assistance of a Spanish interpreter. I last saw [name] in May, at which time she was doing relatively well. She had occasional brief palpitations for which we had agreed to use as needed diltiazem.  Today, Ms Christine Rosales reports that she had been doing well with minimal symptoms up until about a month ago. She was diagnosed with some sort of an infection, believed to be strep throat. Shortly thereafter, she began having more palpitations and pain in the chest. She presented to the ED twice with chest and upper back pain. She was also found to have an elevated d-dimer on one of these occasions and underwent CTA of the chest which was negative for PE or other acute findings. She continues to have some pain along the left posterior chest wall. She does not know of any clear exacerbating or alleviating factors. She also has intermittent palpitations. She has used diltiazem 4 times over the last month and states that she does not like how it makes her feel. Specifically, it causes a pressure-like sensation in her head, as well as fatigue and moodiness. She believes that she tolerated atenolol better in the past. She has been trying to limit her caffeine intake, not drinking any more coffee. However, she continues to drink caffeinated sodas from time to time.  She denies shortness of breath, edema, orthopnea, and PND. She has had some intermittent lightheadedness, most often after taking diltiazem.  --------------------------------------------------------------------------------------------------  Cardiovascular History &  Procedures: Cardiovascular Problems:  Palpitations  Risk Factors:  None  Cath/PCI:  None  CV Surgery:  None  EP Procedures and Devices:  14-day event monitor (11/20/15): Predominantly sinus rhythm with sinus tachycardia (17% of monitoring period). No significant supraventricular or ventricular ectopy. No sustained arrhythmias or prolonged pauses.  Non-Invasive Evaluation(s):  Exercise tolerance test (07/01/16): Diminished exercise capacity with exaggerated heart rate response. No EKG changes to suggest ischemia.  TTE (01/22/16): Normal LV size with LVEF is 60-65% with normal wall motion and diastolic function. Normal RV size and function. No significant valvular abnormalities. Normal pulmonary artery and central venous pressure.  Recent CV Pertinent Labs: Lab Results  Component Value Date   K 3.8 10/25/2016   MG 1.9 11/13/2015   BUN 14 10/25/2016   CREATININE 0.76 10/25/2016   CREATININE 0.79 11/13/2015    Past medical and surgical history were reviewed and updated in EPIC.  Current Meds  Medication Sig  . ALPRAZolam (XANAX) 0.25 MG tablet Take 0.25 mg by mouth.  . baclofen (LIORESAL) 10 MG tablet Take 1 tablet (10 mg total) by mouth 2 (two) times daily. Reported on 06/09/2015  . clonazePAM (KLONOPIN) 0.5 MG tablet 1/2-1 tab po q day as needed anxiety/panic attack  . diltiazem (CARDIZEM) 30 MG tablet Take 30 mg by mouth daily as needed (heart rate).   Marland Kitchen estradiol (VIVELLE-DOT) 0.0375 MG/24HR Place 1 patch onto the skin once a week.  . famciclovir (FAMVIR) 500 MG tablet Take 1 tablet (500 mg total) by mouth 3 (three) times daily.  . fluticasone (FLONASE) 50 MCG/ACT nasal spray Place 2 sprays into both nostrils daily.  . hydrOXYzine (ATARAX/VISTARIL) 25 MG tablet Take 25 mg by mouth  3 (three) times daily as needed (severe anxiety or panic attack).  . meloxicam (MOBIC) 7.5 MG tablet Take 7.5-15 mg by mouth daily.  Marland Kitchen omeprazole (PRILOSEC) 20 MG capsule Take 1 capsule  (20 mg total) by mouth 2 (two) times daily before a meal.  . progesterone (PROMETRIUM) 100 MG capsule Take 100 mg by mouth daily.  Marland Kitchen tobramycin (TOBREX) 0.3 % ophthalmic solution Place 2 drops into the left eye every 6 (six) hours.  . traZODone (DESYREL) 50 MG tablet Take 0.5-1 tablets (25-50 mg total) by mouth at bedtime as needed for sleep.    Allergies: Sulfa antibiotics; Codeine; and Sudafed [pseudoephedrine]  Social History   Social History  . Marital status: Married    Spouse name: N/A  . Number of children: N/A  . Years of education: N/A   Occupational History  . Not on file.   Social History Main Topics  . Smoking status: Never Smoker  . Smokeless tobacco: Never Used  . Alcohol use No  . Drug use: No  . Sexual activity: Yes    Birth control/ protection: Surgical, Other-see comments     Comment: husband has vascetomy   Other Topics Concern  . Not on file   Social History Narrative   Born and raised in [place] by parents. Pt has one older sister. Mom suffered from depression and it affected how she raised the pt. Pt has a college degree in Associate Emergency Medicine. Pt moved to [place] in 2009 due to her husband's job. Pt has bee married 14 yrs and has 2 kids. 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     Family History  Problem Relation Age of Onset  . Depression Mother   . Stroke Mother   . Hypertension Mother   . Depression Sister   . Heart murmur Sister   . Hypertension Father     Review of Systems: Review of Systems  Constitutional: Positive for malaise/fatigue and weight loss (Unexplained weight fluctuations.).  HENT: Negative.   Eyes: Negative.   Respiratory: Positive for cough.   Cardiovascular: Positive for chest pain and palpitations.  Gastrointestinal: Positive for constipation.  Genitourinary: Negative.   Musculoskeletal: Positive for back pain.  Skin: Negative.   Neurological: Positive for dizziness and  headaches.  Endo/Heme/Allergies: Negative.   Psychiatric/Behavioral: The patient is nervous/anxious.    --------------------------------------------------------------------------------------------------  Physical Exam: BP 130/90   Pulse 100   Ht 5\' 4"  (1.626 m)   Wt 129 lb 9.6 oz (58.8 kg)   LMP 10/11/2016   SpO2 99%   BMI 22.25 kg/m   General:  Well-developed, well-nourished woman, seated comfortably in the exam room. HEENT: No conjunctival pallor or scleral icterus. Moist mucous membranes.  OP clear. Neck: Supple without lymphadenopathy, thyromegaly, JVD, or HJR.  Lungs: Normal work of breathing. Clear to auscultation bilaterally without wheezes or crackles. Heart: Regular rate and rhythm without murmurs, rubs, or gallops. Non-displaced PMI. Abd: Bowel sounds present. Soft, NT/ND without hepatosplenomegaly Ext: No lower extremity edema. Radial, PT, and DP pulses are 2+ bilaterally. Skin: Warm and dry without rash.  EKG:  Normal sinus rhythm with nonspecific T-wave changes.  Lab Results  Component Value Date   WBC 6.7 10/25/2016   HGB 10.7 (L) 10/25/2016   HCT 31.7 (L) 10/25/2016   MCV 79.8 10/25/2016   PLT 353 10/25/2016    Lab Results  Component Value Date   NA 136 10/25/2016   K 3.8 10/25/2016  CL 104 10/25/2016   CO2 25 10/25/2016   BUN 14 10/25/2016   CREATININE 0.76 10/25/2016   GLUCOSE 100 (H) 10/25/2016   ALT 11 (L) 10/11/2016    No results found for: CHOL, HDL, LDLCALC, LDLDIRECT, TRIG, CHOLHDL  --------------------------------------------------------------------------------------------------  ASSESSMENT AND PLAN: Palpitations Symptoms were well controlled up until about a month ago, when the patient had an acute infectious process and subsequently developed increasing palpitations, chest pain, and shortness of breath. She has had to use diltiazem 4 times over the last month and feels as though this causes her to be moody and also have headaches and  fatigue. EKG today demonstrates sinus rhythm with a heart rate around the 100. Workup thus far including echo, exercise tolerance test, and event monitor, have been unrevealing except for rapid rise in heart rate felt to be due to deconditioning. We have tried multiple beta blockers in the past, though the patient did not take these regularly due to a multitude of side effects. Today, she requests being switch from diltiazem to atenolol to be used on an as-needed basis secondary to side effects with diltiazem. I will provide her with a prescription for atenolol 25 mg daily as needed for palpitations. I have encouraged her to limit her caffeine intake. She should continue to follow with her PCP, as I feel that the majority of her symptoms are due to anxiety and not a primary cardiac condition.  Follow-up: Return to clinic in 1 year.  [name], MD 11/04/2016 11:40 AM

## 2016-11-04 NOTE — Patient Instructions (Addendum)
Medication Instructions:  Stop Dilitazem.  Use atenolol 25 mg daily as needed for palpitations.  Labwork: None  Testing/Procedures: None   Follow-Up: Your physician wants you to follow-up in: 1 year with Dr End. (October 2019). You will receive a reminder letter in the mail two months in advance. If you don't receive a letter, please call our office to schedule the follow-up appointment.        If you need a refill on your cardiac medications before your next appointment, please call your pharmacy.  Instructions reviewed with patient through video [place] interpreter Anderson Malta- Interpreter [phone].

## 2016-11-11 ENCOUNTER — Telehealth: Payer: Self-pay | Admitting: *Deleted

## 2016-11-11 NOTE — Telephone Encounter (Signed)
Received request for Medical Records from [facility]; forwarded to [place] for email/scana/SLS 10/12

## 2017-03-08 ENCOUNTER — Encounter: Payer: Self-pay | Admitting: Medical

## 2017-03-08 ENCOUNTER — Ambulatory Visit (INDEPENDENT_AMBULATORY_CARE_PROVIDER_SITE_OTHER): Payer: No Typology Code available for payment source | Admitting: Medical

## 2017-03-08 VITALS — BP 140/80 | HR 104 | Temp 99.8°F | Resp 16 | Ht 64.0 in | Wt 125.4 lb

## 2017-03-08 DIAGNOSIS — R062 Wheezing: Secondary | ICD-10-CM | POA: Diagnosis not present

## 2017-03-08 DIAGNOSIS — N926 Irregular menstruation, unspecified: Secondary | ICD-10-CM | POA: Diagnosis not present

## 2017-03-08 DIAGNOSIS — J111 Influenza due to unidentified influenza virus with other respiratory manifestations: Secondary | ICD-10-CM

## 2017-03-08 DIAGNOSIS — J3489 Other specified disorders of nose and nasal sinuses: Secondary | ICD-10-CM | POA: Diagnosis not present

## 2017-03-08 DIAGNOSIS — R059 Cough, unspecified: Secondary | ICD-10-CM

## 2017-03-08 DIAGNOSIS — R05 Cough: Secondary | ICD-10-CM | POA: Diagnosis not present

## 2017-03-08 MED ORDER — OSELTAMIVIR PHOSPHATE 75 MG PO CAPS: 75.0000 mg | ORAL_CAPSULE | Freq: Two times a day (BID) | ORAL | 0 refills | Status: DC

## 2017-03-08 MED ORDER — AZITHROMYCIN 250 MG PO TABS: ORAL_TABLET | ORAL | 0 refills | Status: DC

## 2017-03-08 MED ORDER — BENZONATATE 100 MG PO CAPS: 100.0000 mg | ORAL_CAPSULE | Freq: Three times a day (TID) | ORAL | 0 refills | Status: DC | PRN

## 2017-03-08 MED ORDER — ALBUTEROL SULFATE HFA 108 (90 BASE) MCG/ACT IN AERS: 2.0000 | INHALATION_SPRAY | Freq: Four times a day (QID) | RESPIRATORY_TRACT | 2 refills | Status: DC | PRN

## 2017-03-08 NOTE — Patient Instructions (Signed)
You do appear to have flu type symptoms despite your rapid test coming back as negative.  Rapid test can be falsely negative.  Your body aches just started yesterday.  So you are still in the treatment timeframe in the event you have the flu.  So I am prescribing Tamiflu.  You also appear to have bronchitis and possible sinus infection.  I am prescribing a azithromycin antibiotic.  For cough prescribing benzonatate.  Also making albuterol inhaler available for wheezing.  Work note/return on Monday.  Follow-up in 7 days or as needed.

## 2017-03-08 NOTE — Progress Notes (Signed)
Subjective:    Patient ID: Christine Rosales, female    DOB: [birth year], 42 y.o.   MRN: [id number]  HPI  Pt in for one day of nasal congestion, chest congestion, sinus pressure and chest congestion. Pt symptoms started yesterday. She has some bodyaches and some thigh pain. States notes pain worse than her baseline pain fibromyalgia. Pt feels fatigue. Pt is coughing up some mucus.  Some wheezing recently. Pt needs new proair inhaler.  Some productive cough as well.  LMP- Jan 23, 2017. Pt not on ocp. Pt told in past premenopause. Pt states gyn told her she is not ovulating.   Pt did not get flu vaccine this year.      Review of Systems  Constitutional: Positive for fatigue. Negative for chills and fever.  HENT: Positive for congestion, sinus pressure and sinus pain. Negative for hearing loss and sore throat.   Respiratory: Positive for cough and wheezing.   Cardiovascular: Negative for chest pain and palpitations.  Gastrointestinal: Negative for abdominal pain, nausea and vomiting.  Genitourinary: Negative for dysuria.  Musculoskeletal: Positive for myalgias.  Neurological: Negative for dizziness, facial asymmetry, weakness and headaches.  Hematological: Negative for adenopathy. Does not bruise/bleed easily.  Psychiatric/Behavioral: Negative for behavioral problems and confusion.   Past Medical History:  Diagnosis Date  . Anxiety   . Asthma   . Fibromyalgia   . Headache   . Palpitations   . TMJ (dislocation of temporomandibular joint)      Social History   Socioeconomic History  . Marital status: Married    Spouse name: Not on file  . Number of children: Not on file  . Years of education: Not on file  . Highest education level: Not on file  Social Needs  . Financial resource strain: Not on file  . Food insecurity - worry: Not on file  . Food insecurity - inability: Not on file  . Transportation needs - medical: Not on file  . Transportation needs - non-medical:  Not on file  Occupational History  . Not on file  Tobacco Use  . Smoking status: Never Smoker  . Smokeless tobacco: Never Used  Substance and Sexual Activity  . Alcohol use: No  . Drug use: No  . Sexual activity: Yes    Birth control/protection: Surgical, Other-see comments    Comment: husband has vascetomy  Other Topics Concern  . Not on file  Social History Narrative   Born and raised in [place] by parents. Pt has one older sister. Mom suffered from depression and it affected how she raised the pt. Pt has a college degree in Associate Emergency Medicine. Pt moved to [place] in 2009 due to her husband's job. Pt has bee married 14 yrs and has 2 kids. 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     Past Surgical History:  Procedure Laterality Date  . NO PAST SURGERIES      Family History  Problem Relation Age of Onset  . Depression Mother   . Stroke Mother   . Hypertension Mother   . Depression Sister   . Heart murmur Sister   . Hypertension Father     Allergies  Allergen Reactions  . Sulfa Antibiotics Other (See Comments), Palpitations and Rash    Syncope Syncope Rapid heartrate  . Codeine Rash  . Sudafed [Pseudoephedrine] Rash    Current Outpatient Medications on File Prior to Visit  Medication Sig Dispense Refill  .  ALPRAZolam (XANAX) 0.25 MG tablet Take 0.25 mg by mouth.    Marland Kitchen atenolol (TENORMIN) 25 MG tablet Take 1 tablet daily as needed for palpitations 30 tablet 2  . baclofen (LIORESAL) 10 MG tablet Take 1 tablet (10 mg total) by mouth 2 (two) times daily. Reported on 06/09/2015 60 each 00  . clonazePAM (KLONOPIN) 0.5 MG tablet 1/2-1 tab po q day as needed anxiety/panic attack 14 tablet 0  . estradiol (VIVELLE-DOT) 0.0375 MG/24HR Place 1 patch onto the skin once a week.    . famciclovir (FAMVIR) 500 MG tablet Take 1 tablet (500 mg total) by mouth 3 (three) times daily. 21 tablet 0  . fluticasone (FLONASE) 50 MCG/ACT nasal spray  Place 2 sprays into both nostrils daily.    . hydrOXYzine (ATARAX/VISTARIL) 25 MG tablet Take 25 mg by mouth 3 (three) times daily as needed (severe anxiety or panic attack).    . meloxicam (MOBIC) 7.5 MG tablet Take 7.5-15 mg by mouth daily.    Marland Kitchen omeprazole (PRILOSEC) 20 MG capsule Take 1 capsule (20 mg total) by mouth 2 (two) times daily before a meal. 60 capsule 3  . progesterone (PROMETRIUM) 100 MG capsule Take 100 mg by mouth daily.    Marland Kitchen tobramycin (TOBREX) 0.3 % ophthalmic solution Place 2 drops into the left eye every 6 (six) hours. 5 mL 0   No current facility-administered medications on file prior to visit.     BP 140/80   Pulse (!) 104   Temp 99.8 F (37.7 C) (Oral)   Resp 16   Ht 5\' 4"  (1.626 m)   Wt 125 lb 6.4 oz (56.9 kg)   SpO2 100%   BMI 21.52 kg/m       Objective:   Physical Exam   General  Mental Status - Alert. General Appearance - Well groomed. Not in acute distress.  Skin Rashes- No Rashes.  HEENT Head- Normal. Ear Auditory Canal - Left- Normal. Right - Normal.Tympanic Membrane- Left- Normal. Right- Normal. Eye Sclera/Conjunctiva- Left- Normal. Right- Normal. Nose & Sinuses Nasal Mucosa- Left-  Boggy and Congested. Right-  Boggy and  Congested.Bilateral maxillary and frontal sinus pressure. Mouth & Throat Lips: Upper Lip- Normal: no dryness, cracking, pallor, cyanosis, or vesicular eruption. Lower Lip-Normal: no dryness, cracking, pallor, cyanosis or vesicular eruption. Buccal Mucosa- Bilateral- No Aphthous ulcers. Oropharynx- No Discharge or Erythema. Tonsils: Characteristics- Bilateral- No Erythema or Congestion. Size/Enlargement- Bilateral- No enlargement. Discharge- bilateral-None.  Neck Neck- Supple. No Masses.   Chest and Lung Exam Auscultation: Breath Sounds:-Clear even and unlabored.  Cardiovascular Auscultation:Rythm- Regular, rate and rhythm. Murmurs & Other Heart Sounds:Ausculatation of the heart reveal- No  Murmurs.  Lymphatic Head & Neck General Head & Neck Lymphatics: Bilateral: Description- No Localized lymphadenopathy.      Assessment & Plan:  You do appear to have flu type symptoms despite your rapid test coming back as negative.  Rapid test can be falsely negative.  Your body aches just started yesterday.  So you are still in the treatment timeframe in the event you have the flu.  So I am prescribing Tamiflu.  You also appear to have bronchitis and possible sinus infection.  I am prescribing a azithromycin antibiotic.  For cough prescribing benzonatate.  Also making albuterol inhaler available for wheezing.  Work note/return on Monday.  Follow-up in 7 days or as needed.

## 2017-04-07 ENCOUNTER — Ambulatory Visit (INDEPENDENT_AMBULATORY_CARE_PROVIDER_SITE_OTHER): Payer: No Typology Code available for payment source | Admitting: Family Medicine

## 2017-04-07 ENCOUNTER — Encounter: Payer: Self-pay | Admitting: Family Medicine

## 2017-04-07 VITALS — BP 128/68 | HR 93 | Temp 97.9°F | Ht 64.0 in | Wt 129.1 lb

## 2017-04-07 DIAGNOSIS — M7918 Myalgia, other site: Secondary | ICD-10-CM | POA: Diagnosis not present

## 2017-04-07 MED ORDER — TRAMADOL HCL 50 MG PO TABS: 50.0000 mg | ORAL_TABLET | Freq: Three times a day (TID) | ORAL | 0 refills | Status: DC | PRN

## 2017-04-07 NOTE — Progress Notes (Signed)
Pre visit review using our clinic review tool, if applicable. No additional management support is needed unless otherwise documented below in the visit note. 

## 2017-04-07 NOTE — Patient Instructions (Addendum)
OK to try forearm splint like Band-It.   OK to take Tylenol 1000 mg (2 extra strength tabs) or 975 mg (3 regular strength tabs) every 6 hours as needed.  Heat (pad or rice pillow in microwave) over affected area, 10-15 minutes twice daily.   Ice/cold pack over area for 10-15 min twice daily.  Elbow and Forearm Exercises It is normal to feel mild stretching, pulling, tightness, or discomfort as you do these exercises, but you should stop right away if you feel sudden pain or your pain gets worse.Do not begin these exercises until told by your health care provider. RANGE OF MOTION EXERCISES These exercises warm up your muscles and joints and improve the movement and flexibility of your injured elbow and forearm. These exercises also help to relieve pain, numbness, and tingling.These exercises are done using the muscles in your injured elbow and forearm. Exercise A: Elbow Flexion, Active 1. Hold your left / right arm at your side, and bend your elbow as far as you can using your left / right arm muscles. 2. Hold this position for 30 seconds. 3. Slowly return to the starting position. Repeat 2 times. Complete this exercise 3 times per week. Exercise B: Elbow Extension, Active 1. Hold your left / right arm at your side, and straighten your elbow as much as you can using your left / right arm muscles. 2. Hold this position for 30 seconds. 3. Slowly return to the starting position. Repeat 2 times. Complete this exercise  3 times per week. Exercise C: Forearm Rotation, Supination, Active 1. Stand or sit with your elbows at your sides. 2. Bend your left / right elbow to an "L" shape (90 degrees). 3. Turn your palm upward until you feel a gentle stretch on the inside of your forearm. 4. Hold this position for 30 seconds. 5. Slowly release and return to the starting position. Repeat 2 times. Complete this exercise  3 times per week. Exercise D: Forearm Rotation, Pronation, Active 1. Stand or sit  with your elbows at your side. 2. Bend your left / right elbow to an "L" shape (90 degrees). 3. Turn your left / right palm downward until you feel a gentle stretch on the top of your forearm. 4. Hold this position for 30 seconds. 5. Slowly release and return to the starting position. Repeat2 times. Complete this exercise  3 times per week. STRETCHING EXERCISES These exercises warm up your muscles and joints and improve the movement and flexibility of your injured elbow and forearm. These exercises also help to relieve pain, numbness, and tingling.These exercises are done using your healthy elbow and forearm to help stretch the muscles in your injured elbow and forearm. Exercise I: Elbow Flexion, Supine, Passive 1. Lie on your back. 2. Extend your left / right arm up in the air, bracing it with your other hand. 3. Let your left / right your hand slowly lower toward your shoulder, while your elbow stays pointed toward the ceiling. You should feel a gentle stretch along the back of your upper arm and elbow. 4. If instructed by your health care provider, you may increase the intensity of your stretch by adding a small wrist weight or hand weight. 5. Hold this position for 3 seconds. 6. Slowly return to the starting position. Repeat for a total of 10 repetitions Repeat 2 times. Complete this exercise  3 times per week. Exercise J: Elbow Extension, Supine, Passive  1. Lie on your back. Make sure that you  are in a comfortable position that lets you relax your arm muscles. 2. Place a folded towel under your left / right upper arm so your elbow and shoulder are at the same height. Straighten your left / right arm so your elbow does not rest on the bed or towel. 3. Let the weight of your hand stretch your elbow. Keep your arm and chest muscles relaxed. You should feel a stretch on the inside of your elbow. 4. If told by your health care provider, you may increase the intensity of your stretch by adding  a small wrist weight or hand weight. 5. Hold this position for3 seconds. 6. Slowly release the stretch. Repeat for a total of 10 repetitions. Repeat 2 times. Complete this exercise  3 times per week. STRENGTHENING EXERCISES These exercises build strength and endurance in your elbow and forearm. Endurance is the ability to use your muscles for a long time, even after they get tired. Exercise K: Elbow Flexion, Isometric  1. Stand or sit up straight. 2. Bend your left / right elbow in an "L" shape (90 degrees) and turn your palm up so your forearm is at the height of your waist. 3. Place your other hand on top of your forearm. Gently push down as your left / right arm resists. Push as hard as you can with both arms without causing any pain or movement at your left / right elbow. 4. Hold this position for 3 seconds. 5. Slowly release the tension in both arms. Let your muscles relax completely before repeating. Repeat 2 times. Complete this exercise 3 times per week. Exercise L: Elbow Extensors, Isometric  1. Stand or sit up straight. 2. Place your left / right arm so your palm faces your abdomen and it is at the height of your waist. 3. Place your other hand on the underside of your forearm. Gently push up as your left / right arm resists. Push as hard as you can with both arms, without causing any pain or movement at your left / right elbow. 4. Hold this position for 3 seconds. 5. Slowly release the tension in both arms. Let your muscles relax completely before repeating for 10 total repetitions. Repeat 2 times. Complete this exercise  3 times per week. Exercise M: Elbow Flexion With Forearm Palm Up  1. Sit upright on a firm chair without armrests, or stand. 2. Place your left / right arm at your side with your palm facing forward. 3. Holding a __________weight or gripping a rubber exercise band or tubing, bend your elbow to bring your hand toward your shoulder. 4. Hold this position for 3   seconds. 5. Slowly return to the starting position. Repeat for a Total of 10 repetitions Repeat 2 times. Complete this exercise  3 times per week. Exercise N: Elbow Extension  1. Sit on a firm chair without armrests, or stand. 2. Keeping your upper arms at your sides, bring both hands up toward your left / right shoulder while you grip a rubber exercise band or tubing. Your left / right hand should be just below the other hand. 3. Straighten your left / right elbow. 4. Hold this position for 3 seconds. 5. Control the resistance of the band or tubing as your hand returns to your side. Repeat 2 times. Complete this exercise __________times a day. 3 times per week. Exercise O: Forearm Rotation, Supination  1. Sit with your left / right forearm supported on a table. Keep your elbow at  waist height. 2. Rest your hand over the edge of the table with your palm facing down. 3. Gently hold a lightweight hammer. 4. Without moving your elbow, slowly rotate your forearm to turn your palm and hand upward to a "thumbs-up" position. 5. Hold this position for 3 seconds. 6. Slowly return to the starting position. Repeat 2 times. Complete this exercise  3 times per week. Exercise P: Forearm Rotation, Pronation  1. Sit with your left / right forearm supported on a table. Keep your elbow below shoulder height. 2. Rest your hand over the edge of the table with your palm facing up. 3. Gently hold a lightweight hammer. 4. Without moving your elbow, slowly rotate your forearm to turn your palm and hand upward to a "thumbs-up" position. 5. Hold this position for 3 seconds. 6. Slowly return to the starting position. Repeat 2 times. Complete this exercise 3 times per week. Make sure you discuss any questions you have with your health care provider. Document Released: 12/01/2004 Document Revised: 05/28/2015 Document Reviewed: 10/12/2014 Elsevier Interactive Patient Education  [employer].

## 2017-04-07 NOTE — Progress Notes (Signed)
Musculoskeletal Exam  Patient: Christine Rosales DOB: December 29, 1975  DOS: 04/07/2017  SUBJECTIVE:  Chief Complaint:   Chief Complaint  Patient presents with  . Arm Pain    left    Christine Rosales is a 42 y.o.  female for evaluation and treatment of L arm pain.   Onset:  5 days ago.  No inj or change in activity.  Location: Forearm Character:  stabbing and throbbing  Progression of issue:  is unchanged Associated symptoms: none Treatment: to date has been prescription NSAIDS and muscle relaxers.   Neurovascular symptoms: no  ROS: Musculoskeletal/Extremities: +L forearm pain  Past Medical History:  Diagnosis Date  . Anxiety   . Asthma   . Fibromyalgia   . Headache   . Palpitations   . TMJ (dislocation of temporomandibular joint)     Objective: VITAL SIGNS: BP 128/68 (BP Location: Left Arm, Patient Position: Sitting, Cuff Size: Normal)   Pulse 93   Temp 97.9 F (36.6 C) (Oral)   Ht 5\' 4"  (1.626 m)   Wt 129 lb 2 oz (58.6 kg)   SpO2 99%   BMI 22.16 kg/m  Constitutional: Well formed, well developed. No acute distress. Cardiovascular: Brisk cap refill Thorax & Lungs: No accessory muscle use Musculoskeletal: L forearm.   Normal active range of motion: yes.   Normal passive range of motion: yes Tenderness to palpation: yes, over brachioradialis just distal to the elbow Deformity: no Ecchymosis: no Tests positive: none Tests negative: [place] Neurologic: Normal sensory function. No focal deficits noted. DTR's equal and symmetry in UE's. No clonus. Psychiatric: Normal mood. Age appropriate judgment and insight. Alert & oriented x 3.    Assessment:  Brachioradialis muscle tenderness - Plan: traMADol (ULTRAM) 50 MG tablet  Plan: Stretches and exercises for the elbow/forearm given.  Could try an elbow strap as well.  Heat, ice, Tylenol, meloxicam.  Stop using naproxen in conjunction with meloxicam.  Tramadol for breakthrough pain. F/u prn in 3-4 weeks, will  consider PT vs OMT.  The patient voiced understanding and agreement to the plan.   [place], DO 04/07/17  2:29 PM

## 2017-04-12 ENCOUNTER — Ambulatory Visit (INDEPENDENT_AMBULATORY_CARE_PROVIDER_SITE_OTHER): Payer: No Typology Code available for payment source | Admitting: Psychology

## 2017-04-12 DIAGNOSIS — F410 Panic disorder [episodic paroxysmal anxiety] without agoraphobia: Secondary | ICD-10-CM

## 2017-04-22 ENCOUNTER — Emergency Department (HOSPITAL_BASED_OUTPATIENT_CLINIC_OR_DEPARTMENT_OTHER)
Admission: EM | Admit: 2017-04-22 | Discharge: 2017-04-22 | Disposition: A | Discharge status: Home / Self Care | Payer: No Typology Code available for payment source | Attending: Emergency Medicine | Admitting: Emergency Medicine

## 2017-04-22 ENCOUNTER — Encounter: Payer: Self-pay | Admitting: Emergency Medicine

## 2017-04-22 ENCOUNTER — Emergency Department (HOSPITAL_BASED_OUTPATIENT_CLINIC_OR_DEPARTMENT_OTHER): Payer: No Typology Code available for payment source

## 2017-04-22 ENCOUNTER — Other Ambulatory Visit: Payer: Self-pay

## 2017-04-22 DIAGNOSIS — R110 Nausea: Secondary | ICD-10-CM | POA: Insufficient documentation

## 2017-04-22 DIAGNOSIS — Z79899 Other long term (current) drug therapy: Secondary | ICD-10-CM | POA: Insufficient documentation

## 2017-04-22 DIAGNOSIS — N939 Abnormal uterine and vaginal bleeding, unspecified: Secondary | ICD-10-CM | POA: Diagnosis not present

## 2017-04-22 DIAGNOSIS — J45909 Unspecified asthma, uncomplicated: Secondary | ICD-10-CM | POA: Diagnosis not present

## 2017-04-22 DIAGNOSIS — R42 Dizziness and giddiness: Secondary | ICD-10-CM | POA: Diagnosis not present

## 2017-04-22 DIAGNOSIS — R1030 Lower abdominal pain, unspecified: Secondary | ICD-10-CM | POA: Diagnosis not present

## 2017-04-22 LAB — URINALYSIS, ROUTINE W REFLEX MICROSCOPIC

## 2017-04-22 LAB — URINALYSIS, MICROSCOPIC (REFLEX)

## 2017-04-22 LAB — CBC WITH DIFFERENTIAL/PLATELET
Basophils Absolute: 0 10*3/uL (ref 0.0–0.1)
Basophils Relative: 0 %
Eosinophils Absolute: 0 10*3/uL (ref 0.0–0.7)
Eosinophils Relative: 0 %
HCT: 34.7 % — ABNORMAL LOW (ref 36.0–46.0)
Hemoglobin: 11.8 g/dL — ABNORMAL LOW (ref 12.0–15.0)
Lymphocytes Relative: 13 %
Lymphs Abs: 1.3 10*3/uL (ref 0.7–4.0)
MCH: 27.6 pg (ref 26.0–34.0)
MCHC: 34 g/dL (ref 30.0–36.0)
MCV: 81.1 fL (ref 78.0–100.0)
Monocytes Absolute: 0.8 10*3/uL (ref 0.1–1.0)
Monocytes Relative: 8 %
Neutro Abs: 8.2 10*3/uL — ABNORMAL HIGH (ref 1.7–7.7)
Neutrophils Relative %: 79 %
Platelets: 318 10*3/uL (ref 150–400)
RBC: 4.28 MIL/uL (ref 3.87–5.11)
RDW: 15.8 % — ABNORMAL HIGH (ref 11.5–15.5)
WBC: 10.4 10*3/uL (ref 4.0–10.5)

## 2017-04-22 LAB — COMPREHENSIVE METABOLIC PANEL
ALT: 10 U/L — ABNORMAL LOW (ref 14–54)
AST: 16 U/L (ref 15–41)
Albumin: 4 g/dL (ref 3.5–5.0)
Alkaline Phosphatase: 63 U/L (ref 38–126)
Anion gap: 6 (ref 5–15)
BUN: 13 mg/dL (ref 6–20)
CO2: 25 mmol/L (ref 22–32)
Calcium: 8.8 mg/dL — ABNORMAL LOW (ref 8.9–10.3)
Chloride: 104 mmol/L (ref 101–111)
Creatinine, Ser: 0.63 mg/dL (ref 0.44–1.00)
GFR calc Af Amer: >60 mL/min (ref >60)
GFR calc non Af Amer: >60 mL/min (ref >60)
Glucose, Bld: 98 mg/dL (ref 65–99)
Potassium: 3.6 mmol/L (ref 3.5–5.1)
Sodium: 135 mmol/L (ref 135–145)
Total Bilirubin: 0.6 mg/dL (ref 0.3–1.2)
Total Protein: 7.5 g/dL (ref 6.5–8.1)

## 2017-04-22 LAB — WET PREP, GENITAL
Clue Cells Wet Prep HPF POC: NONE SEEN
Sperm: NONE SEEN
Trich, Wet Prep: NONE SEEN
Yeast Wet Prep HPF POC: NONE SEEN

## 2017-04-22 LAB — PREGNANCY, URINE: PREG TEST UR: NEGATIVE

## 2017-04-22 MED ORDER — ONDANSETRON HCL 4 MG PO TABS: 4.0000 mg | ORAL_TABLET | Freq: Four times a day (QID) | ORAL | 0 refills | Status: DC

## 2017-04-22 MED ORDER — IBUPROFEN 600 MG PO TABS: 600.0000 mg | ORAL_TABLET | Freq: Four times a day (QID) | ORAL | 0 refills | Status: DC | PRN

## 2017-04-22 MED ORDER — SODIUM CHLORIDE 0.9 % IV BOLUS (SEPSIS)
500.0000 mL | Freq: Once | INTRAVENOUS | Status: AC
  Administered 2017-04-22: 500 mL via INTRAVENOUS

## 2017-04-22 MED ORDER — HYDROCODONE-ACETAMINOPHEN 5-325 MG PO TABS: 1.0000 | ORAL_TABLET | Freq: Four times a day (QID) | ORAL | 0 refills | Status: DC | PRN

## 2017-04-22 MED ORDER — ONDANSETRON HCL 4 MG/2ML IJ SOLN
4.0000 mg | Freq: Once | INTRAMUSCULAR | Status: AC
  Administered 2017-04-22: 4 mg via INTRAVENOUS
  Filled 2017-04-22: qty 2

## 2017-04-22 MED ORDER — MORPHINE SULFATE (PF) 4 MG/ML IV SOLN
4.0000 mg | Freq: Once | INTRAVENOUS | Status: DC
  Filled 2017-04-22: qty 1

## 2017-04-22 NOTE — ED Notes (Signed)
Pt does not want morphine at this time, requested Toradol. States she just took ibuprofen about 1 hour ago, discussed that she cannot have toradol at this time. PA made aware.

## 2017-04-22 NOTE — ED Notes (Signed)
ED Provider at bedside. 

## 2017-04-22 NOTE — ED Provider Notes (Signed)
[facility] EMERGENCY DEPARTMENT Provider Note   CSN: [id number] Arrival date & time: 04/22/17  1325     History   Chief Complaint Chief Complaint  Patient presents with  . Abdominal Pain    HPI [name] is a 42 y.o. female with history of anxiety, asthma, fibromyalgia who presents with a 1 hour history of lower abdominal pain.  Patient has had associated nausea, vaginal bleeding, and lightheadedness. She describes her pain as a pressure. She reports her pain radiates to her back. Patient reports she has not had a menstrual cycle and 3 months.  She began with vaginal bleeding today that was more than she has ever had in her life.  She is sexually active with her husband and not on birth control.  She was told by her OB/GYN that she was may be perimenopausal.  She has had a regular periods in the past.   She took ibuprofen and Tylenol prior to arrival.  She has had normal bowel movements. She denies chest pain, shortness of breath, vomiting, diarrhea, and urinary symptoms.  Patient was given intramuscular injection of steroid for her arm pain yesterday.  She states she has been anxious and agitated since, and wonders if this is a side effect.  HPI  Past Medical History:  Diagnosis Date  . Anxiety   . Asthma   . Fibromyalgia   . Headache   . Palpitations   . TMJ (dislocation of temporomandibular joint)     Patient Active Problem List   Diagnosis Date Noted  . GAD (generalized anxiety disorder) 04/07/2015  . Panic disorder without agoraphobia 04/07/2015  . Anxiety state 02/27/2015  . Palpitations 02/27/2015  . Fibromyalgia 02/27/2015  . 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 NO PAST SURGERIES       OB History   None      Home Medications    Prior to Admission medications   Medication Sig Start Date End Date Taking? Authorizing Provider  albuterol (PROVENTIL HFA;VENTOLIN HFA) 108 (90 Base) MCG/ACT  inhaler Inhale 2 puffs into the lungs every 6 (six) hours as needed for wheezing or shortness of breath. Can give proair. 03/08/17   Saguier, Percell Miller, PA-C  ALPRAZolam Duanne Moron) 0.25 MG tablet Take 0.25 mg by mouth. 10/17/16   [provider]  atenolol (TENORMIN) 25 MG tablet Take 1 tablet daily as needed for palpitations 11/04/16   End, Harrell Gave, MD  baclofen (LIORESAL) 10 MG tablet Take 1 tablet (10 mg total) by mouth 2 (two) times daily. Reported on 06/09/2015 09/22/16   Saguier, Percell Miller, PA-C  clonazePAM Bobbye Charleston) 0.5 MG tablet 1/2-1 tab po q day as needed anxiety/panic attack 10/07/16   Saguier, Percell Miller, PA-C  estradiol (VIVELLE-DOT) 0.0375 MG/24HR Place 1 patch onto the skin once a week.    [provider]  famciclovir (FAMVIR) 500 MG tablet Take 1 tablet (500 mg total) by mouth 3 (three) times daily. 10/25/16   Saguier, Percell Miller, PA-C  fluticasone (FLONASE) 50 MCG/ACT nasal spray Place 2 sprays into both nostrils daily. 05/25/16   [provider]  HYDROcodone-acetaminophen (NORCO/VICODIN) 5-325 MG tablet Take 1-2 tablets by mouth every 6 (six) hours as needed for severe pain. 04/22/17   Frederica Kuster, PA-C  hydrOXYzine (ATARAX/VISTARIL) 25 MG tablet Take 25 mg by mouth 3 (three) times daily as needed (severe anxiety or panic attack).    [provider]  ibuprofen (ADVIL,MOTRIN) 600 MG tablet Take 1 tablet (  600 mg total) by mouth every 6 (six) hours as needed. 04/22/17   Yissel Habermehl, Bea Graff, PA-C  meloxicam (MOBIC) 7.5 MG tablet Take 7.5-15 mg by mouth daily.    [provider]  omeprazole (PRILOSEC) 20 MG capsule Take 1 capsule (20 mg total) by mouth 2 (two) times daily before a meal. 03/02/15   Saguier, Percell Miller, PA-C  ondansetron (ZOFRAN) 4 MG tablet Take 1 tablet (4 mg total) by mouth every 6 (six) hours. 04/22/17   Velma Agnes, Bea Graff, PA-C  progesterone (PROMETRIUM) 100 MG capsule Take 100 mg by mouth daily.    [provider]  tobramycin (TOBREX) 0.3 %  ophthalmic solution Place 2 drops into the left eye every 6 (six) hours. 09/22/16   Saguier, Percell Miller, PA-C  traMADol (ULTRAM) 50 MG tablet Take 1 tablet (50 mg total) by mouth every 8 (eight) hours as needed. 04/07/17   Shelda Pal, DO    Family History Family History  Problem Relation Age of Onset  . Depression Mother   . Stroke Mother   . Hypertension Mother   . Depression Sister   . Heart murmur Sister   . Hypertension Father     Social History Social History   Tobacco Use  . Smoking status: Never Smoker  . Smokeless tobacco: Never Used  Substance Use Topics  . Alcohol use: No  . Drug use: No     Allergies   Sulfa antibiotics; Codeine; and Sudafed [pseudoephedrine]   Review of Systems Review of Systems  Constitutional: Negative for chills and fever.  HENT: Negative for facial swelling and sore throat.   Respiratory: Negative for shortness of breath.   Cardiovascular: Negative for chest pain.  Gastrointestinal: Positive for abdominal pain and nausea. Negative for diarrhea and vomiting.  Genitourinary: Positive for flank pain, pelvic pain and vaginal bleeding. Negative for dysuria.  Musculoskeletal: Positive for back pain.  Skin: Negative for rash and wound.  Neurological: Positive for light-headedness. Negative for headaches.  Psychiatric/Behavioral: The patient is not nervous/anxious.      Physical Exam Updated Vital Signs BP 112/84   Pulse 78   Temp 98 F (36.7 C) (Oral)   Resp 20   LMP 01/20/2017 (Exact Date)   SpO2 99%   Physical Exam  Constitutional: She appears well-developed and well-nourished. No distress.  HENT:  Head: Normocephalic and atraumatic.  Mouth/Throat: Oropharynx is clear and moist. No oropharyngeal exudate.  Eyes: Pupils are equal, round, and reactive to light. Conjunctivae are normal. Right eye exhibits no discharge. Left eye exhibits no discharge. No scleral icterus.  Neck: Normal range of motion. Neck supple. No  thyromegaly present.  Cardiovascular: Normal rate, regular rhythm, normal heart sounds and intact distal pulses. Exam reveals no gallop and no friction rub.  No murmur heard. Pulmonary/Chest: Effort normal and breath sounds normal. No stridor. No respiratory distress. She has no wheezes. She has no rales.  Abdominal: Soft. Bowel sounds are normal. She exhibits no distension. There is tenderness in the suprapubic area and left lower quadrant. There is no rigidity, no rebound, no guarding, no CVA tenderness, no tenderness at McBurney's point and negative Murphy's sign.  Genitourinary: Uterus is tender. Cervix exhibits motion tenderness. Right adnexum displays tenderness. Left adnexum displays tenderness. There is bleeding in the vagina.  Musculoskeletal: She exhibits no edema.  Lymphadenopathy:    She has no cervical adenopathy.  Neurological: She is alert. Coordination normal.  Skin: Skin is warm and dry. No rash noted. She is not diaphoretic. No  pallor.  Psychiatric: She has a normal mood and affect.  Nursing note and vitals reviewed.    ED Treatments / Results  Labs (all labs ordered are listed, but only abnormal results are displayed) Labs Reviewed  WET PREP, GENITAL - Abnormal; Notable for the following components:      Result Value   WBC, Wet Prep HPF POC FEW (*)    All other components within normal limits  URINALYSIS, ROUTINE W REFLEX MICROSCOPIC - Abnormal; Notable for the following components:   Color, Urine RED (*)    APPearance TURBID (*)    Glucose, UA   (*)    Value: TEST NOT REPORTED DUE TO COLOR INTERFERENCE OF URINE PIGMENT   Hgb urine dipstick   (*)    Value: TEST NOT REPORTED DUE TO COLOR INTERFERENCE OF URINE PIGMENT   Bilirubin Urine   (*)    Value: TEST NOT REPORTED DUE TO COLOR INTERFERENCE OF URINE PIGMENT   Ketones, ur   (*)    Value: TEST NOT REPORTED DUE TO COLOR INTERFERENCE OF URINE PIGMENT   Protein, ur   (*)    Value: TEST NOT REPORTED DUE TO COLOR  INTERFERENCE OF URINE PIGMENT   Nitrite   (*)    Value: TEST NOT REPORTED DUE TO COLOR INTERFERENCE OF URINE PIGMENT   Leukocytes, UA   (*)    Value: TEST NOT REPORTED DUE TO COLOR INTERFERENCE OF URINE PIGMENT   All other components within normal limits  COMPREHENSIVE METABOLIC PANEL - Abnormal; Notable for the following components:   Calcium 8.8 (*)    ALT 10 (*)    All other components within normal limits  CBC WITH DIFFERENTIAL/PLATELET - Abnormal; Notable for the following components:   Hemoglobin 11.8 (*)    HCT 34.7 (*)    RDW 15.8 (*)    Neutro Abs 8.2 (*)    All other components within normal limits  URINALYSIS, MICROSCOPIC (REFLEX) - Abnormal; Notable for the following components:   Bacteria, UA MANY (*)    Squamous Epithelial / LPF 6-30 (*)    All other components within normal limits  URINE CULTURE  PREGNANCY, URINE  GC/CHLAMYDIA PROBE AMP ([place]) NOT AT [facility]    EKG None  Radiology US Transvaginal Non-ob  Result Date: 04/22/2017 CLINICAL DATA:  Generalized pelvic pain. EXAM: TRANSABDOMINAL AND TRANSVAGINAL ULTRASOUND OF PELVIS DOPPLER ULTRASOUND OF OVARIES TECHNIQUE: Both transabdominal and transvaginal ultrasound examinations of the pelvis were performed. Transabdominal technique was performed for global imaging of the pelvis including uterus, ovaries, adnexal regions, and pelvic cul-de-sac. It was necessary to proceed with endovaginal exam following the transabdominal exam to visualize the endometrium and ovaries. Color and duplex Doppler ultrasound was utilized to evaluate blood flow to the ovaries. COMPARISON:  None. FINDINGS: Uterus Measurements: 8.2 x 4.8 x 5.4 cm. Uterus is retroflexed with nabothian cysts in the cervix. Endometrium Thickness: 4 mm.  No focal abnormality visualized. Right ovary Measurements: 2.8 x 1.4 x 1.8 cm.  Probable 15 mm hemorrhagic cyst. Left ovary Measurements: 2.6 x 1.3 x 1.8 cm. 1.5 cm mildly complicated follicle. No suspicious  masses. Pulsed Doppler evaluation of both ovaries demonstrates normal low-resistance arterial and venous waveforms. Other findings Trace physiologic fluid in the pelvis. IMPRESSION: 1. No acute abnormalities. Probable small hemorrhagic cyst in the right ovary. Electronically Signed   By: Dorise Bullion III M.D   On: 04/22/2017 16:32   US Pelvis Complete  Result Date: 04/22/2017 CLINICAL DATA:  Generalized pelvic  pain. EXAM: TRANSABDOMINAL AND TRANSVAGINAL ULTRASOUND OF PELVIS DOPPLER ULTRASOUND OF OVARIES TECHNIQUE: Both transabdominal and transvaginal ultrasound examinations of the pelvis were performed. Transabdominal technique was performed for global imaging of the pelvis including uterus, ovaries, adnexal regions, and pelvic cul-de-sac. It was necessary to proceed with endovaginal exam following the transabdominal exam to visualize the endometrium and ovaries. Color and duplex Doppler ultrasound was utilized to evaluate blood flow to the ovaries. COMPARISON:  None. FINDINGS: Uterus Measurements: 8.2 x 4.8 x 5.4 cm. Uterus is retroflexed with nabothian cysts in the cervix. Endometrium Thickness: 4 mm.  No focal abnormality visualized. Right ovary Measurements: 2.8 x 1.4 x 1.8 cm.  Probable 15 mm hemorrhagic cyst. Left ovary Measurements: 2.6 x 1.3 x 1.8 cm. 1.5 cm mildly complicated follicle. No suspicious masses. Pulsed Doppler evaluation of both ovaries demonstrates normal low-resistance arterial and venous waveforms. Other findings Trace physiologic fluid in the pelvis. IMPRESSION: 1. No acute abnormalities. Probable small hemorrhagic cyst in the right ovary. Electronically Signed   By: Dorise Bullion III M.D   On: 04/22/2017 16:32   [place] Art/ven Flow Abd Pelv Doppler  Result Date: 04/22/2017 CLINICAL DATA:  Generalized pelvic pain. EXAM: TRANSABDOMINAL AND TRANSVAGINAL ULTRASOUND OF PELVIS DOPPLER ULTRASOUND OF OVARIES TECHNIQUE: Both transabdominal and transvaginal ultrasound examinations of the  pelvis were performed. Transabdominal technique was performed for global imaging of the pelvis including uterus, ovaries, adnexal regions, and pelvic cul-de-sac. It was necessary to proceed with endovaginal exam following the transabdominal exam to visualize the endometrium and ovaries. Color and duplex Doppler ultrasound was utilized to evaluate blood flow to the ovaries. COMPARISON:  None. FINDINGS: Uterus Measurements: 8.2 x 4.8 x 5.4 cm. Uterus is retroflexed with nabothian cysts in the cervix. Endometrium Thickness: 4 mm.  No focal abnormality visualized. Right ovary Measurements: 2.8 x 1.4 x 1.8 cm.  Probable 15 mm hemorrhagic cyst. Left ovary Measurements: 2.6 x 1.3 x 1.8 cm. 1.5 cm mildly complicated follicle. No suspicious masses. Pulsed Doppler evaluation of both ovaries demonstrates normal low-resistance arterial and venous waveforms. Other findings Trace physiologic fluid in the pelvis. IMPRESSION: 1. No acute abnormalities. Probable small hemorrhagic cyst in the right ovary. Electronically Signed   By: Dorise Bullion III M.D   On: 04/22/2017 16:32    Procedures Procedures (including critical care time)  Medications Ordered in ED Medications  morphine 4 MG/ML injection 4 mg (4 mg Intravenous Refused 04/22/17 1433)  sodium chloride 0.9 % bolus 500 mL (0 mLs Intravenous Stopped 04/22/17 1452)  ondansetron (ZOFRAN) injection 4 mg (4 mg Intravenous Given 04/22/17 1431)     Initial Impression / Assessment and Plan / ED Course  I have reviewed the triage vital signs and the nursing notes.  Pertinent labs & imaging results that were available during my care of the patient were reviewed by me and considered in my medical decision making (see chart for details).     Patient with suspected dysfunctional uterine bleeding.  Labs are stable, mild anemia improved from 5 months ago, hemoglobin 11.8 today.  Pelvic exam pan painful.  Wet prep shows few WBCs only.  Considering bleeding, UA not super  helpful.  Patient denies any urinary symptoms.  Will send for culture, but defer treatment at this time.  Urine pregnancy negative.  Pelvic ultrasound shows no acute abnormalities, but probable small hemorrhagic cyst in the right ovary and 1.5 cm mildly complicated follicle in the left ovary.  Suspect perimenopausal state the patient and after 3 months without menstrual  cycle, patient had large uterine contractions today causing her pain.  Patient has no significant right lower quadrant pain or tenderness.  Pain mostly most localized to suprapubic and left lower quadrant area.  Patient advised to follow-up with her OB/GYN as soon as possible.  Will discharge home with ibuprofen and short course of Norco for severe pain.  Patient also be given Zofran.  Patient advised that her anxiety and agitation may very well be side effect of steroid injection and that duration of medication should improve symptoms. Return precautions discussed.  Patient understands and agrees with plan.  Patient vitals stable throughout ED course and discharged in satisfactory condition.   Final Clinical Impressions(s) / ED Diagnoses   Final diagnoses:  Lower abdominal pain  Vaginal bleeding    ED Discharge Orders        Ordered    ibuprofen (ADVIL,MOTRIN) 600 MG tablet  Every 6 hours PRN     04/22/17 1659    HYDROcodone-acetaminophen (NORCO/VICODIN) 5-325 MG tablet  Every 6 hours PRN     04/22/17 1659    ondansetron (ZOFRAN) 4 MG tablet  Every 6 hours     04/22/17 1659       Anselmo Reihl, Olga, PA-C 04/22/17 1707    Charlesetta Shanks, MD 04/23/17 1649

## 2017-04-22 NOTE — ED Triage Notes (Addendum)
PResents with right lower abdominal pain that radiates into the right flank, she reports that she has not had a period in 3 months, this abdominal pain began today and she has not urinated at all today. She endorses vaginal spotting today. She was seen yester day by the doctor and had a prednisone shot for her right arm pain that was severe/

## 2017-04-22 NOTE — Discharge Instructions (Signed)
Medications: Ibuprofen, Norco, Zofran  Treatment: Take ibuprofen every 6 hours as needed for your pain.  For severe pain, take 1-2 Norco every 6 hours as needed.  Do not combine this medication with Tylenol.  Take Zofran every 6 hours as needed for nausea or vomiting.  Follow-up: Please see your OB/GYN as soon as possible for further evaluation and treatment.  Please return to the emergency department if you develop any new or worsening symptoms.

## 2017-04-24 LAB — URINE CULTURE

## 2017-04-24 LAB — GC/CHLAMYDIA PROBE AMP (~~LOC~~) NOT AT ARMC
Chlamydia: NEGATIVE
Neisseria Gonorrhea: NEGATIVE

## 2017-04-26 ENCOUNTER — Ambulatory Visit: Payer: No Typology Code available for payment source | Admitting: Psychology

## 2017-05-10 ENCOUNTER — Ambulatory Visit (INDEPENDENT_AMBULATORY_CARE_PROVIDER_SITE_OTHER): Payer: No Typology Code available for payment source | Admitting: Psychology

## 2017-05-10 DIAGNOSIS — F410 Panic disorder [episodic paroxysmal anxiety] without agoraphobia: Secondary | ICD-10-CM

## 2017-05-24 ENCOUNTER — Ambulatory Visit (INDEPENDENT_AMBULATORY_CARE_PROVIDER_SITE_OTHER): Payer: No Typology Code available for payment source | Admitting: Psychology

## 2017-05-24 DIAGNOSIS — F410 Panic disorder [episodic paroxysmal anxiety] without agoraphobia: Secondary | ICD-10-CM

## 2017-06-21 ENCOUNTER — Telehealth: Payer: Self-pay | Admitting: Medical

## 2017-06-21 NOTE — Telephone Encounter (Signed)
Copied from [place] [id number]. Topic: Quick Communication - Rx Refill/Question >> Jun 21, 2017 11:54 AM Cleaster Corin, NT wrote: Medication: omeprazole (PRILOSEC) 20 MG capsule [[id number]]  diclofenac (VOLTAREN) 75 MG EC tablet [[id number]]  Has the patient contacted their pharmacy? Yes  (Agent: If no, request that the patient contact the pharmacy for the refill.) (Agent: If yes, when and what did the pharmacy advise?)  Preferred Pharmacy (with phone number or street name):[facility] [address] [place], [place] - [address] [place] [address] Phone: [phone] Fax: [phone]    Agent: Please be advised that RX refills may take up to 3 business days. We ask that you follow-up with your pharmacy.

## 2017-06-21 NOTE — Telephone Encounter (Signed)
Pt requesting refill of Omeprazole, last filled on 03/02/15  Refill request for Voltaren not on pt's current medication list.   LOV 03/08/17 Zane Herald

## 2017-06-23 MED ORDER — OMEPRAZOLE 20 MG PO CPDR: 20.0000 mg | DELAYED_RELEASE_CAPSULE | Freq: Two times a day (BID) | ORAL | 3 refills | Status: DC

## 2017-06-23 NOTE — Addendum Note (Signed)
Addended by: Anabel Halon on: 06/23/2017 05:37 PM   Modules accepted: Orders

## 2017-06-23 NOTE — Telephone Encounter (Signed)
Would you call patient and let her know we can give her diclofenac prescription but she needs to stop Motrin and meloxicam.  These were on her list and I discontinued those.  I will be in the office on Tuesday so you could go ahead and send in the prescription of diclofenac 75 mg # 30 tablet p.o. twice daily.  I did not send it on Friday since want to verify that she understands not to take other NSAIDs.

## 2017-06-27 NOTE — Telephone Encounter (Signed)
Called patient, she already has the rx and taking. She dc Motrin and meloxicam.

## 2017-07-06 MED ORDER — DICLOFENAC SODIUM 75 MG PO TBEC: 75.0000 mg | DELAYED_RELEASE_TABLET | Freq: Two times a day (BID) | ORAL | 1 refills | Status: DC

## 2017-07-06 NOTE — Addendum Note (Signed)
Addended by: Hinton Dyer on: 07/06/2017 11:18 AM   Modules accepted: Orders

## 2017-07-06 NOTE — Telephone Encounter (Signed)
Rx sent to pharmacy   

## 2017-07-06 NOTE — Telephone Encounter (Signed)
Pt came in office stating that she is still waiting to get her rx for diclofenac. Pt states requested meds and pharmacy still has not received rx. Please advise at [employer]  at [employer]. (pt is coming tomorrow for her appt with provider)

## 2017-07-07 ENCOUNTER — Encounter (HOSPITAL_BASED_OUTPATIENT_CLINIC_OR_DEPARTMENT_OTHER): Payer: Self-pay | Admitting: Emergency Medicine

## 2017-07-07 ENCOUNTER — Other Ambulatory Visit: Payer: Self-pay

## 2017-07-07 ENCOUNTER — Ambulatory Visit: Payer: No Typology Code available for payment source | Admitting: Medical

## 2017-07-07 ENCOUNTER — Emergency Department (HOSPITAL_BASED_OUTPATIENT_CLINIC_OR_DEPARTMENT_OTHER): Payer: No Typology Code available for payment source

## 2017-07-07 ENCOUNTER — Emergency Department (HOSPITAL_BASED_OUTPATIENT_CLINIC_OR_DEPARTMENT_OTHER)
Admission: EM | Admit: 2017-07-07 | Discharge: 2017-07-07 | Disposition: A | Discharge status: Home / Self Care | Payer: No Typology Code available for payment source | Attending: Emergency Medicine | Admitting: Emergency Medicine

## 2017-07-07 DIAGNOSIS — Z0289 Encounter for other administrative examinations: Secondary | ICD-10-CM

## 2017-07-07 DIAGNOSIS — Z79899 Other long term (current) drug therapy: Secondary | ICD-10-CM | POA: Insufficient documentation

## 2017-07-07 DIAGNOSIS — J45909 Unspecified asthma, uncomplicated: Secondary | ICD-10-CM | POA: Insufficient documentation

## 2017-07-07 DIAGNOSIS — R0789 Other chest pain: Secondary | ICD-10-CM | POA: Diagnosis present

## 2017-07-07 LAB — CBC
HEMATOCRIT: 36.3 % (ref 36.0–46.0)
Hemoglobin: 12.6 g/dL (ref 12.0–15.0)
MCH: 28.6 pg (ref 26.0–34.0)
MCHC: 34.7 g/dL (ref 30.0–36.0)
MCV: 82.3 fL (ref 78.0–100.0)
Platelets: 305 10*3/uL (ref 150–400)
RBC: 4.41 MIL/uL (ref 3.87–5.11)
RDW: 13.6 % (ref 11.5–15.5)
WBC: 7.3 10*3/uL (ref 4.0–10.5)

## 2017-07-07 LAB — BASIC METABOLIC PANEL
Anion gap: 5 (ref 5–15)
BUN: 15 mg/dL (ref 6–20)
CHLORIDE: 105 mmol/L (ref 101–111)
CO2: 28 mmol/L (ref 22–32)
Calcium: 8.9 mg/dL (ref 8.9–10.3)
Creatinine, Ser: 0.89 mg/dL (ref 0.44–1.00)
GFR calc Af Amer: >60 mL/min (ref >60)
GFR calc non Af Amer: >60 mL/min (ref >60)
GLUCOSE: 102 mg/dL — AB (ref 65–99)
POTASSIUM: 3.3 mmol/L — AB (ref 3.5–5.1)
Sodium: 138 mmol/L (ref 135–145)

## 2017-07-07 LAB — PREGNANCY, URINE: Preg Test, Ur: NEGATIVE

## 2017-07-07 LAB — HEPATIC FUNCTION PANEL
ALT: 11 U/L — ABNORMAL LOW (ref 14–54)
AST: 16 U/L (ref 15–41)
Albumin: 3.9 g/dL (ref 3.5–5.0)
Alkaline Phosphatase: 58 U/L (ref 38–126)
Bilirubin, Direct: 0.1 mg/dL (ref 0.1–0.5)
Indirect Bilirubin: 0.4 mg/dL (ref 0.3–0.9)
Total Bilirubin: 0.5 mg/dL (ref 0.3–1.2)
Total Protein: 7.2 g/dL (ref 6.5–8.1)

## 2017-07-07 LAB — D-DIMER, QUANTITATIVE: D-Dimer, Quant: <0.27 ug/mL-FEU (ref 0.00–0.50)

## 2017-07-07 LAB — TROPONIN I: Troponin I: <0.03 ng/mL (ref <0.03)

## 2017-07-07 MED ORDER — SODIUM CHLORIDE 0.9 % IV BOLUS
500.0000 mL | Freq: Once | INTRAVENOUS | Status: AC
  Administered 2017-07-07: 500 mL via INTRAVENOUS

## 2017-07-07 MED ORDER — KETOROLAC TROMETHAMINE 15 MG/ML IJ SOLN
15.0000 mg | Freq: Once | INTRAMUSCULAR | Status: AC
  Administered 2017-07-07: 15 mg via INTRAVENOUS
  Filled 2017-07-07: qty 1

## 2017-07-07 MED ORDER — IBUPROFEN 600 MG PO TABS: 600.0000 mg | ORAL_TABLET | Freq: Three times a day (TID) | ORAL | 0 refills | Status: DC | PRN

## 2017-07-07 NOTE — ED Triage Notes (Signed)
Patient reports chest pain x3days.  Reports dizziness which began this morning and felt like her arms were tingling.  Also endorses nausea and shortness of breath.

## 2017-07-07 NOTE — ED Provider Notes (Signed)
[place] EMERGENCY DEPARTMENT Provider Note   CSN: [id number] Arrival date & time: 07/07/17  0755     History   Chief Complaint Chief Complaint  Patient presents with  . Chest Pain    HPI Christine Rosales is a 42 y.o. female.  The history is provided by the patient. No language interpreter was used.  Chest Pain      Christine Rosales is a 42 y.o. female who presents to the Emergency Department complaining of chest pain. He presents to the emergency department complaining of right sided chest pain that woke her this morning. Pain is described as an inflammation and pulsating type sensation. It is constant nature and worse with sitting up as well as worse with breathing. She has associated nausea and mild shortness of breath. She denies any fevers, cough, abdominal pain, vomiting, leg swelling or pain. She has a history of hypertension and noted that her blood pressure was 160/110 at home with a heart rate of 130. She did take her atenolol prior to presentation to the emergency department. She has a history of anxiety but this feels very different than her typical anxiety. She is on oral contraceptives. She is a non-smoker, no alcohol or drug use.  Past Medical History:  Diagnosis Date  . Anxiety   . Asthma   . Fibromyalgia   . Headache   . Palpitations   . TMJ (dislocation of temporomandibular joint)     Patient Active Problem List   Diagnosis Date Noted  . GAD (generalized anxiety disorder) 04/07/2015  . Panic disorder without agoraphobia 04/07/2015  . Anxiety state 02/27/2015  . Palpitations 02/27/2015  . Fibromyalgia 02/27/2015  . 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 NO PAST SURGERIES       OB History   None      Home Medications    Prior to Admission medications   Medication Sig Start Date End Date Taking? Authorizing Provider  albuterol (PROVENTIL HFA;VENTOLIN HFA) 108 (90 Base) MCG/ACT  inhaler Inhale 2 puffs into the lungs every 6 (six) hours as needed for wheezing or shortness of breath. Can give proair. 03/08/17   Saguier, Percell Miller, PA-C  ALPRAZolam Duanne Moron) 0.25 MG tablet Take 0.25 mg by mouth. 10/17/16   [provider]  atenolol (TENORMIN) 25 MG tablet Take 1 tablet daily as needed for palpitations 11/04/16   End, Harrell Gave, MD  baclofen (LIORESAL) 10 MG tablet Take 1 tablet (10 mg total) by mouth 2 (two) times daily. Reported on 06/09/2015 09/22/16   Saguier, Percell Miller, PA-C  clonazePAM (KLONOPIN) 0.5 MG tablet 1/2-1 tab po q day as needed anxiety/panic attack 10/07/16   Saguier, Percell Miller, PA-C  diclofenac (VOLTAREN) 75 MG EC tablet Take 1 tablet (75 mg total) by mouth 2 (two) times daily. 07/06/17   Saguier, Percell Miller, PA-C  estradiol (VIVELLE-DOT) 0.0375 MG/24HR Place 1 patch onto the skin once a week.    [provider]  famciclovir (FAMVIR) 500 MG tablet Take 1 tablet (500 mg total) by mouth 3 (three) times daily. 10/25/16   Saguier, Percell Miller, PA-C  fluticasone (FLONASE) 50 MCG/ACT nasal spray Place 2 sprays into both nostrils daily. 05/25/16   [provider]  HYDROcodone-acetaminophen (NORCO/VICODIN) 5-325 MG tablet Take 1-2 tablets by mouth every 6 (six) hours as needed for severe pain. 04/22/17   Frederica Kuster, PA-C  hydrOXYzine (ATARAX/VISTARIL) 25 MG tablet Take 25 mg by mouth 3 (three) times daily as needed (  severe anxiety or panic attack).    [provider]  ibuprofen (ADVIL,MOTRIN) 600 MG tablet Take 1 tablet (600 mg total) by mouth every 8 (eight) hours as needed. 07/07/17   Quintella Reichert, MD  omeprazole (PRILOSEC) 20 MG capsule Take 1 capsule (20 mg total) by mouth 2 (two) times daily before a meal. 06/23/17   Saguier, Percell Miller, PA-C  ondansetron (ZOFRAN) 4 MG tablet Take 1 tablet (4 mg total) by mouth every 6 (six) hours. 04/22/17   Law, Bea Graff, PA-C  progesterone (PROMETRIUM) 100 MG capsule Take 100 mg by mouth daily.    [provider]   tobramycin (TOBREX) 0.3 % ophthalmic solution Place 2 drops into the left eye every 6 (six) hours. 09/22/16   Saguier, Percell Miller, PA-C  traMADol (ULTRAM) 50 MG tablet Take 1 tablet (50 mg total) by mouth every 8 (eight) hours as needed. 04/07/17   Shelda Pal, DO    Family History Family History  Problem Relation Age of Onset  . Depression Mother   . Stroke Mother   . Hypertension Mother   . Depression Sister   . Heart murmur Sister   . Hypertension Father     Social History Social History   Tobacco Use  . Smoking status: Never Smoker  . Smokeless tobacco: Never Used  Substance Use Topics  . Alcohol use: No  . Drug use: No     Allergies   Sulfa antibiotics; Codeine; and Sudafed [pseudoephedrine]   Review of Systems Review of Systems  Cardiovascular: Positive for chest pain.  All other systems reviewed and are negative.    Physical Exam Updated Vital Signs BP (!) 126/96   Pulse (!) 58   Temp 98.3 F (36.8 C) (Oral)   Resp 15   Ht 5\' 4"  (1.626 m)   Wt 56.7 kg (125 lb)   LMP 05/08/2017 (Approximate)   SpO2 100%   BMI 21.46 kg/m   Physical Exam  Constitutional: She is oriented to person, place, and time. She appears well-developed and well-nourished.  HENT:  Head: Normocephalic and atraumatic.  Cardiovascular: Normal rate and regular rhythm.  No murmur heard. Pulmonary/Chest: Effort normal and breath sounds normal. No respiratory distress.  Mild tenderness over the right anterior and central chest wall.  Abdominal: Soft. There is no rebound and no guarding.  Mild right upper quadrant tenderness without guarding or rebound  Musculoskeletal: She exhibits no edema or tenderness.  Neurological: She is alert and oriented to person, place, and time.  Skin: Skin is warm and dry.  Psychiatric: She has a normal mood and affect. Her behavior is normal.  Nursing note and vitals reviewed.    ED Treatments / Results  Labs (all labs ordered are listed,  but only abnormal results are displayed) Labs Reviewed  BASIC METABOLIC PANEL - Abnormal; Notable for the following components:      Result Value   Potassium 3.3 (*)    Glucose, Bld 102 (*)    All other components within normal limits  HEPATIC FUNCTION PANEL - Abnormal; Notable for the following components:   ALT 11 (*)    All other components within normal limits  CBC  TROPONIN I  PREGNANCY, URINE  D-DIMER, QUANTITATIVE (NOT AT [facility])    EKG EKG Interpretation  Date/Time:  Friday July 07 2017 08:04:31 EDT Ventricular Rate:  84 PR Interval:    QRS Duration: 107 QT Interval:  394 QTC Calculation: 466 R Axis:   68 Text Interpretation:  Sinus rhythm  Confirmed by Quintella Reichert [phone]) on 07/07/2017 8:13:18 AM   Radiology Dg Chest 2 View  Result Date: 07/07/2017 CLINICAL DATA:  Chest pain for 3 days.  Cough and congestion. EXAM: CHEST - 2 VIEW COMPARISON:  None. FINDINGS: The heart size and mediastinal contours are within normal limits. Both lungs are clear. The visualized skeletal structures are unremarkable. IMPRESSION: No active cardiopulmonary disease. Electronically Signed   By: Staci Righter M.D.   On: 07/07/2017 09:08    Procedures Procedures (including critical care time)  Medications Ordered in ED Medications  sodium chloride 0.9 % bolus 500 mL (0 mLs Intravenous Stopped 07/07/17 0905)  ketorolac (TORADOL) 15 MG/ML injection 15 mg (15 mg Intravenous Given 07/07/17 0933)     Initial Impression / Assessment and Plan / ED Course  I have reviewed the triage vital signs and the nursing notes.  Pertinent labs & imaging results that were available during my care of the patient were reviewed by me and considered in my medical decision making (see chart for details).     Patient here for evaluation of right sided chest pain that began this morning. She does have reproducible tenderness on examination. Current presentation is not consistent with ACS, PE, dissection,  cholecystitis. No evidence of pneumonia. Discussed with patient home care for musculoskeletal chest wall pain. Discussed outpatient follow-up as well as return precautions.  Final Clinical Impressions(s) / ED Diagnoses   Final diagnoses:  Chest wall pain    ED Discharge Orders        Ordered    ibuprofen (ADVIL,MOTRIN) 600 MG tablet  Every 8 hours PRN     07/07/17 0948       Quintella Reichert, MD 07/07/17 1342

## 2017-09-05 ENCOUNTER — Ambulatory Visit: Payer: Self-pay | Admitting: Psychology

## 2017-09-11 ENCOUNTER — Encounter: Payer: Self-pay | Admitting: Medical

## 2017-09-11 ENCOUNTER — Telehealth: Payer: Self-pay

## 2017-09-11 ENCOUNTER — Ambulatory Visit (INDEPENDENT_AMBULATORY_CARE_PROVIDER_SITE_OTHER): Payer: No Typology Code available for payment source | Admitting: Medical

## 2017-09-11 ENCOUNTER — Ambulatory Visit (HOSPITAL_BASED_OUTPATIENT_CLINIC_OR_DEPARTMENT_OTHER)
Admission: RE | Admit: 2017-09-11 | Discharge: 2017-09-11 | Disposition: A | Discharge status: Home / Self Care | Payer: No Typology Code available for payment source | Source: Ambulatory Visit | Attending: Medical | Admitting: Medical

## 2017-09-11 ENCOUNTER — Telehealth: Payer: Self-pay | Admitting: Medical

## 2017-09-11 VITALS — BP 139/90 | HR 104 | Temp 98.4°F | Resp 16 | Ht 64.0 in | Wt 126.6 lb

## 2017-09-11 DIAGNOSIS — M940 Chondrocostal junction syndrome [Tietze]: Secondary | ICD-10-CM

## 2017-09-11 DIAGNOSIS — M25552 Pain in left hip: Secondary | ICD-10-CM

## 2017-09-11 DIAGNOSIS — M791 Myalgia, unspecified site: Secondary | ICD-10-CM | POA: Diagnosis not present

## 2017-09-11 DIAGNOSIS — R0781 Pleurodynia: Secondary | ICD-10-CM | POA: Insufficient documentation

## 2017-09-11 DIAGNOSIS — W19XXXA Unspecified fall, initial encounter: Secondary | ICD-10-CM | POA: Diagnosis not present

## 2017-09-11 DIAGNOSIS — M546 Pain in thoracic spine: Secondary | ICD-10-CM | POA: Insufficient documentation

## 2017-09-11 DIAGNOSIS — N912 Amenorrhea, unspecified: Secondary | ICD-10-CM

## 2017-09-11 LAB — SEDIMENTATION RATE: Sed Rate: 2 mm/hr (ref 0–20)

## 2017-09-11 LAB — FOLLICLE STIMULATING HORMONE: FSH: 78 m[IU]/mL

## 2017-09-11 LAB — C-REACTIVE PROTEIN: CRP: 0.1 mg/dL — AB (ref 0.5–20.0)

## 2017-09-11 MED ORDER — DICLOFENAC SODIUM 75 MG PO TBEC: 75.0000 mg | DELAYED_RELEASE_TABLET | Freq: Two times a day (BID) | ORAL | 1 refills | Status: DC

## 2017-09-11 MED ORDER — GABAPENTIN 100 MG PO CAPS: 100.0000 mg | ORAL_CAPSULE | Freq: Three times a day (TID) | ORAL | 0 refills | Status: DC

## 2017-09-11 NOTE — Telephone Encounter (Signed)
Please result her urine pregnancy test that was negative.

## 2017-09-11 NOTE — Patient Instructions (Addendum)
For areas of pain, will get tspine and rib xray(with chest).  For pain will rx diclofenac and gabapentin. Pain likely combination of pain from trauma. Some of pain maybe impacted by fibromyalgia. With gabapentin remember to start slowly as discussed.  Some costochondritis type pain features. If constant chest pain/cardiac like then ED evaluation.  Referred to PT.  For amennorhea we got urine preg test. Which was negative. Will also get fsh.  For hx of myalgia repeating inflammation tests.  Follow up in 2-3 weeks.

## 2017-09-11 NOTE — Progress Notes (Signed)
Subjective:    Patient ID: Christine Rosales, female    DOB: 03-24-1975, 43 y.o.   MRN: [id number]  HPI   Pt in for evaluation.   She states she had some left buttock bruise and left hip pain after fall on escalators in mid June. She states some rib area left side. Also some left upper chest pain since fall. Pain on palpation of left upper chest and when someone hugs her.  Pt states fall happened on July 15, 2017. She had xray done and no fracture present.  Xray showed no acute fx. Report she bring me states no acute fracture, no dislocation. No abnormal soft tissue density.   Pt did go to PT in [place]. They advised she continue medication.  Pt in addition notes that former rheumatologist thought she had fibromyglia. She notes with SSRI her pain resolved in past but also had side effects. Pt states she stopped sertraline 2 months ago. Also used prosac in past as well. She thought it affected her personality. She states now her mood feels much better without ssri.  Pt states tramadol gave her severe insomnia and felt sob.   Pt has never used gabapetin.  Lyrica caused tachycardia.   Review of Systems  Constitutional: Negative for chills, fatigue and fever.  Respiratory: Negative for cough, chest tightness, shortness of breath and wheezing.   Cardiovascular: Negative for chest pain.  Gastrointestinal: Negative for constipation.  Musculoskeletal:       Left rib pain.  Her left hip region still hurts.  Upper left side chest pain. On palpation and when she hugs people.  Skin: Negative for rash.  Psychiatric/Behavioral: Negative for agitation, behavioral problems, confusion, dysphoric mood, self-injury and suicidal ideas. The patient is not nervous/anxious.     Past Medical History:  Diagnosis Date  . Anxiety   . Asthma   . Fibromyalgia   . Headache   . Palpitations   . TMJ (dislocation of temporomandibular joint)      Social History   Socioeconomic History  .  Marital status: Married    Spouse name: Not on file  . Number of children: Not on file  . Years of education: Not on file  . Highest education level: Not on file  Occupational History  . Not on file  Social Needs  . Financial resource strain: Not on file  . Food insecurity:    Worry: Not on file    Inability: Not on file  . Transportation needs:    Medical: Not on file    Non-medical: Not on file  Tobacco Use  . Smoking status: Never Smoker  . Smokeless tobacco: Never Used  Substance and Sexual Activity  . Alcohol use: No  . Drug use: No  . Sexual activity: Yes    Birth control/protection: Surgical, Other-see comments    Comment: husband has vascetomy  Lifestyle  . Physical activity:    Days per week: Not on file    Minutes per session: Not on file  . Stress: Not on file  Relationships  . Social connections:    Talks on phone: Not on file    Gets together: Not on file    Attends religious service: Not on file    Active member of club or organization: Not on file    Attends meetings of clubs or organizations: Not on file    Relationship status: Not on file  . Intimate partner violence:    Fear of current or ex  partner: Not on file    Emotionally abused: Not on file    Physically abused: Not on file    Forced sexual activity: Not on file  Other Topics Concern  . Not on file  Social History Narrative   Born and raised in [place] by parents. Pt has one older sister. Mom suffered from depression and it affected how she raised the pt. Pt has a college degree in Associate Emergency Medicine. Pt moved to [place] in 2009 due to her husband's job. Pt has bee married 14 yrs and has 2 kids. 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     Past Surgical History:  Procedure Laterality Date  . NO PAST SURGERIES      Family History  Problem Relation Age of Onset  . Depression Mother   . Stroke Mother   . Hypertension Mother   . Depression  Sister   . Heart murmur Sister   . Hypertension Father     Allergies  Allergen Reactions  . Sulfa Antibiotics Other (See Comments), Palpitations and Rash    Syncope Syncope Rapid heartrate  . Codeine Rash  . Sudafed [Pseudoephedrine] Rash    Current Outpatient Medications on File Prior to Visit  Medication Sig Dispense Refill  . albuterol (PROVENTIL HFA;VENTOLIN HFA) 108 (90 Base) MCG/ACT inhaler Inhale 2 puffs into the lungs every 6 (six) hours as needed for wheezing or shortness of breath. Can give proair. 1 Inhaler 2  . ALPRAZolam (XANAX) 0.25 MG tablet Take 0.25 mg by mouth.    Marland Kitchen atenolol (TENORMIN) 25 MG tablet Take 1 tablet daily as needed for palpitations 30 tablet 2  . baclofen (LIORESAL) 10 MG tablet Take 1 tablet (10 mg total) by mouth 2 (two) times daily. Reported on 06/09/2015 60 each 00  . diclofenac (VOLTAREN) 75 MG EC tablet Take 1 tablet (75 mg total) by mouth 2 (two) times daily. 30 tablet 1  . estradiol (VIVELLE-DOT) 0.0375 MG/24HR Place 1 patch onto the skin once a week.    . hydrOXYzine (ATARAX/VISTARIL) 25 MG tablet Take 25 mg by mouth 3 (three) times daily as needed (severe anxiety or panic attack).    Marland Kitchen omeprazole (PRILOSEC) 20 MG capsule Take 1 capsule (20 mg total) by mouth 2 (two) times daily before a meal. 60 capsule 3  . progesterone (PROMETRIUM) 100 MG capsule Take 100 mg by mouth daily.     No current facility-administered medications on file prior to visit.     BP 139/90   Pulse (!) 104   Temp 98.4 F (36.9 C) (Oral)   Resp 16   Ht 5\' 4"  (1.626 m)   Wt 126 lb 9.6 oz (57.4 kg)   SpO2 100%   BMI 21.73 kg/m       Objective:   Physical Exam  General Mental Status- Alert. General Appearance- Not in acute distress.   Skin General: Color- Normal Color. Moisture- Normal Moisture.  Neck Carotid Arteries- Normal color. Moisture- Normal Moisture. No carotid bruits. No JVD.  Chest and Lung Exam Auscultation: Breath  Sounds:-Normal.  Cardiovascular Auscultation:Rythm- Regular. Murmurs & Other Heart Sounds:Auscultation of the heart reveals- No Murmurs.  Abdomen Inspection:-Inspeection Normal. Palpation/Percussion:Note:No mass. Palpation and Percussion of the abdomen reveal- Non Tender, Non Distended + BS, no rebound or guarding.    Neurologic Cranial Nerve exam:- CN III-XII intact(No nystagmus), symmetric smile. Strength:- 5/5 equal and symmetric strength both upper and lower extremities.  Thoracic- faint upper  mid spine pain on palpation. Left side ribs- mild pain on palpaitton. Anterior thorax- reproducible pain on palpation left upper pectoralis and costochondral junction. Left hip- mild pain on range of motion and palpatation      Assessment & Plan:  For areas of pain, will get tspine and rib xray(with chest).  For pain will rx diclofenac and gabapentin. Pain likely combination of pain from trauma. Some of pain maybe impacted by fibromyalgia. With gabapentin remember to start slowly as discussed.  Some costochondritis type pain features. If constant chest pain/cardiac like then ED evaluation.  Referred to PT.  For amennorhea we got urine preg test. Which was negative. Will also get fsh.  For hx of myalgia repeating inflammation tests.  Follow up in 2-3 weeks.   Mackie Pai, PA-C

## 2017-09-11 NOTE — Telephone Encounter (Signed)
Copied from [place] [id number]. Topic: General - Other >> Sep 11, 2017  4:11 PM Yvette Rack wrote: Reason for CRM: Skylar with [facility] states patient walked in to there location stating she was referred to them however they have not received a referral. Skylar requests referral for pt as insurance requests referral. Cb# [phone]   Fax# [phone]

## 2017-09-11 NOTE — Telephone Encounter (Signed)
Just saw pt this afternoon. Wrote the PT referral. Was not aware she was going to try to go to PT office this afternoon and try to get PT started. So can you go ahead and call [facility] tomorrow morning

## 2017-09-12 LAB — POCT URINE PREGNANCY: Preg Test, Ur: NEGATIVE

## 2017-09-12 NOTE — Addendum Note (Signed)
Addended by: Hinton Dyer on: 09/12/2017 09:58 AM   Modules accepted: Orders

## 2017-09-13 LAB — ANA: Anti Nuclear Antibody(ANA): POSITIVE — AB

## 2017-09-13 LAB — HLA-B27 ANTIGEN: HLA-B27 ANTIGEN: NEGATIVE

## 2017-09-13 LAB — ANTI-NUCLEAR AB-TITER (ANA TITER): ANA Titer 1: 1:80 {titer} — ABNORMAL HIGH

## 2017-09-13 LAB — RHEUMATOID FACTOR: Rhuematoid fact SerPl-aCnc: <14 IU/mL (ref <14)

## 2017-09-15 ENCOUNTER — Telehealth: Payer: Self-pay | Admitting: *Deleted

## 2017-09-15 NOTE — Telephone Encounter (Signed)
Received Physician Orders from [facility]; forwarded to provider/SLS 08/16

## 2017-10-06 ENCOUNTER — Encounter: Payer: No Typology Code available for payment source | Admitting: Medical

## 2017-10-30 ENCOUNTER — Telehealth: Payer: Self-pay | Admitting: *Deleted

## 2017-10-30 NOTE — Telephone Encounter (Signed)
Received Physician Orders from [facility]; forwarded to provider/SLS 09/30

## 2017-11-14 ENCOUNTER — Encounter: Payer: Self-pay | Admitting: Medical

## 2018-01-05 ENCOUNTER — Other Ambulatory Visit: Payer: Self-pay

## 2018-01-05 MED ORDER — BACLOFEN 10 MG PO TABS
10.0000 mg | ORAL_TABLET | Freq: Two times a day (BID) | ORAL | Status: DC
Start: 2018-01-05 — End: 2018-05-16

## 2018-02-01 ENCOUNTER — Emergency Department (HOSPITAL_BASED_OUTPATIENT_CLINIC_OR_DEPARTMENT_OTHER)
Admission: EM | Admit: 2018-02-01 | Discharge: 2018-02-01 | Disposition: A | Discharge status: Home / Self Care | Payer: No Typology Code available for payment source | Attending: Emergency Medicine | Admitting: Emergency Medicine

## 2018-02-01 ENCOUNTER — Encounter (HOSPITAL_BASED_OUTPATIENT_CLINIC_OR_DEPARTMENT_OTHER): Payer: Self-pay

## 2018-02-01 ENCOUNTER — Other Ambulatory Visit: Payer: Self-pay

## 2018-02-01 DIAGNOSIS — R5383 Other fatigue: Secondary | ICD-10-CM | POA: Insufficient documentation

## 2018-02-01 DIAGNOSIS — Z5321 Procedure and treatment not carried out due to patient leaving prior to being seen by health care provider: Secondary | ICD-10-CM | POA: Insufficient documentation

## 2018-02-01 NOTE — ED Notes (Signed)
Pt called x3 with no response  

## 2018-02-01 NOTE — ED Triage Notes (Signed)
Pt c/o URI sx, fatigue, CP started this am-NAD-steady gait

## 2018-02-28 ENCOUNTER — Encounter: Payer: No Typology Code available for payment source | Admitting: Medical

## 2018-02-28 ENCOUNTER — Encounter

## 2018-02-28 ENCOUNTER — Encounter: Payer: Self-pay | Admitting: Medical

## 2018-03-01 ENCOUNTER — Encounter: Payer: No Typology Code available for payment source | Admitting: Medical

## 2018-03-02 ENCOUNTER — Encounter: Payer: Self-pay | Admitting: Medical

## 2018-03-02 ENCOUNTER — Ambulatory Visit (INDEPENDENT_AMBULATORY_CARE_PROVIDER_SITE_OTHER): Payer: No Typology Code available for payment source | Admitting: Medical

## 2018-03-02 VITALS — BP 134/88 | HR 72 | Temp 98.1°F | Resp 16 | Ht 64.0 in | Wt 132.8 lb

## 2018-03-02 DIAGNOSIS — Z79899 Other long term (current) drug therapy: Secondary | ICD-10-CM

## 2018-03-02 DIAGNOSIS — G4700 Insomnia, unspecified: Secondary | ICD-10-CM

## 2018-03-02 DIAGNOSIS — M797 Fibromyalgia: Secondary | ICD-10-CM | POA: Diagnosis not present

## 2018-03-02 DIAGNOSIS — M940 Chondrocostal junction syndrome [Tietze]: Secondary | ICD-10-CM

## 2018-03-02 DIAGNOSIS — F419 Anxiety disorder, unspecified: Secondary | ICD-10-CM

## 2018-03-02 DIAGNOSIS — Z0000 Encounter for general adult medical examination without abnormal findings: Secondary | ICD-10-CM

## 2018-03-02 DIAGNOSIS — R002 Palpitations: Secondary | ICD-10-CM

## 2018-03-02 LAB — CBC WITH DIFFERENTIAL/PLATELET
BASOS PCT: 0.5 % (ref 0.0–3.0)
Basophils Absolute: 0 10*3/uL (ref 0.0–0.1)
EOS PCT: 1.2 % (ref 0.0–5.0)
Eosinophils Absolute: 0.1 10*3/uL (ref 0.0–0.7)
HCT: 41.1 % (ref 36.0–46.0)
Hemoglobin: 14 g/dL (ref 12.0–15.0)
LYMPHS ABS: 2.7 10*3/uL (ref 0.7–4.0)
Lymphocytes Relative: 42.4 % (ref 12.0–46.0)
MCHC: 34.1 g/dL (ref 30.0–36.0)
MCV: 90.1 fl (ref 78.0–100.0)
Monocytes Absolute: 0.6 10*3/uL (ref 0.1–1.0)
Monocytes Relative: 9.5 % (ref 3.0–12.0)
Neutro Abs: 2.9 10*3/uL (ref 1.4–7.7)
Neutrophils Relative %: 46.4 % (ref 43.0–77.0)
Platelets: 295 10*3/uL (ref 150.0–400.0)
RBC: 4.56 Mil/uL (ref 3.87–5.11)
RDW: 13.6 % (ref 11.5–15.5)
WBC: 6.3 10*3/uL (ref 4.0–10.5)

## 2018-03-02 LAB — COMPREHENSIVE METABOLIC PANEL
ALBUMIN: 4.3 g/dL (ref 3.5–5.2)
ALT: 8 U/L (ref 0–35)
AST: 13 U/L (ref 0–37)
Alkaline Phosphatase: 66 U/L (ref 39–117)
BUN: 13 mg/dL (ref 6–23)
CHLORIDE: 100 meq/L (ref 96–112)
CO2: 29 mEq/L (ref 19–32)
Calcium: 9.2 mg/dL (ref 8.4–10.5)
Creatinine, Ser: 0.71 mg/dL (ref 0.40–1.20)
GFR: 90.88 mL/min (ref >60.00)
Glucose, Bld: 83 mg/dL (ref 70–99)
POTASSIUM: 4 meq/L (ref 3.5–5.1)
SODIUM: 137 meq/L (ref 135–145)
Total Bilirubin: 0.6 mg/dL (ref 0.2–1.2)
Total Protein: 6.8 g/dL (ref 6.0–8.3)

## 2018-03-02 LAB — LIPID PANEL
CHOLESTEROL: 228 mg/dL — AB (ref 0–200)
HDL: 46.8 mg/dL (ref >39.00)
LDL CALC: 160 mg/dL — AB (ref 0–99)
NonHDL: 181.47
Total CHOL/HDL Ratio: 5
Triglycerides: 109 mg/dL (ref 0.0–149.0)
VLDL: 21.8 mg/dL (ref 0.0–40.0)

## 2018-03-02 MED ORDER — ALPRAZOLAM 0.25 MG PO TABS: ORAL_TABLET | ORAL | 0 refills | Status: DC

## 2018-03-02 MED ORDER — HYDROXYZINE HCL 25 MG PO TABS
ORAL_TABLET | ORAL | 1 refills | Status: DC
Start: 2018-03-02 — End: 2018-05-16

## 2018-03-02 MED ORDER — ATENOLOL 25 MG PO TABS: ORAL_TABLET | ORAL | 2 refills | Status: DC

## 2018-03-02 MED ORDER — KETOROLAC TROMETHAMINE 30 MG/ML IJ SOLN
30.0000 mg | Freq: Once | INTRAMUSCULAR | Status: AC
  Administered 2018-03-02: 30 mg via INTRAMUSCULAR

## 2018-03-02 MED ORDER — FLUOXETINE HCL 20 MG PO TABS
20.0000 mg | ORAL_TABLET | Freq: Every day | ORAL | 3 refills | Status: DC
Start: 2018-03-02 — End: 2018-05-16

## 2018-03-02 NOTE — Patient Instructions (Signed)
For you wellness exam today I have ordered cbc,cmp, lipid panel, and ua.  Vaccine up to date.  Recommend exercise and healthy diet.  We will let you know lab results as they come in.  Follow up date appointment will be determined after lab review.   For history of chronic intermittent anxiety, I did send prescription of benzodiazepine to your pharmacy.  You signed controlled medication contract and will get UDS today.  You do also have some occasional insomnia and find hydroxyzine useful so I did send in prescription of that as well.  For history of palpitations, I did refill your beta-blocker.  For myalgia did refill your Prozac.  For your intermittent signs symptoms that seem consistent with costochondritis, we did give you Toradol 30 mg IM injection today.  I want to see if this subsides/stop your pain and for how long if it does help.  Various work-ups with specialist revealed no cause and treatment failure with prednisone.  The pain does not seem cardiac either.  After get update from you on Monday or Tuesday needed with Toradol might refer you to physical therapy to get their opinion on this chronic costochondral junction pain on the right side.  Follow-up date to be determined after lab review.   Cuidados preventivos en las mujeres de 29 a 46 aos de edad Preventive Care 40-64 Years, Female Introduccin Los cuidados preventivos hacen referencia a las opciones en cuanto al estilo de vida y a las visitas al mdico, las cuales pueden promover la salud y Musician. Qu incluyen los cuidados preventivos?   Un examen fsico anual. Esto tambin se conoce como control de bienestar anual.  Exmenes dentales [place] al ao.  Exmenes de la vista de rutina. Pregntele al mdico con qu frecuencia debe realizarse un control de la vista.  Opciones personales de estilo de vida, que incluyen lo siguiente: ? [employer] y las encas a diario. ? Realizar actividad  fsica con regularidad. ? Seguir una dieta saludable. ? Evitar el consumo de tabaco y drogas. ? Limitar el consumo de bebidas alcohlicas. ? Counsellor. ? Tomar una dosis baja de aspirina diariamente a partir de los 74 aos de [place]. ? Tomar los suplementos de vitaminas o minerales como se lo haya indicado el mdico. Qu sucede durante un control de bienestar anual? Los servicios y exmenes de deteccin realizados por su mdico durante el control de bienestar anual dependern de su salud general, sus factores de riesgo de estilo de vida y sus antecedentes familiares de enfermedades. Asesoramiento Su mdico puede preguntarle acerca de:  Su consumo de alcohol.  Su consumo de tabaco.  Su consumo de drogas.  Su bienestar emocional.  El bienestar en el hogar y sus relaciones personales.  Su actividad sexual.  Sus hbitos de alimentacin.  Su trabajo y [place] laboral.  Mtodos anticonceptivos.  Su ciclo menstrual.  Sus antecedentes de Media planner. Pruebas de deteccin Pueden hacerle las siguientes pruebas o mediciones:  Estatura, peso e ndice de masa muscular [facility]).  Presin arterial.  Niveles de lpidos y colesterol. Estos se pueden verificar cada 5 aos o, con ms frecuencia, si usted tiene ms de 67 aos de edad.  Control de la piel.  Pruebas de deteccin de cncer de pulmn. Es posible que se le realice esta prueba de deteccin a partir de los 7 aos de edad, si ha fumado durante 30 aos un paquete diario y sigue fumando o dej el hbito en algn  momento en los ltimos 15 aos.  Pruebas de Programme researcher, broadcasting/film/video de Surveyor, minerals. Todos los adultos a partir de los 52 aos de edad y [place] 50 aos de edad deben hacerse esta prueba de deteccin. El mdico puede recomendarle las pruebas de deteccin a partir de los 21 aos de [place]. Le realizarn pruebas cada 1 a 10 aos, segn los [place] y el tipo de prueba de Programme researcher, broadcasting/film/video. [employer] [place] [employer] tienen un mayor riesgo  deben comenzar con las pruebas de deteccin a una edad ms temprana. Las pruebas de deteccin pueden incluir: ? Prueba de sangre oculta en materia fecal con guayacol. ? Prueba inmunoqumica fecal (PIF). ? Prueba de cido desoxirribonucleico (ADN) en heces. ? Colonoscopa virtual. ? Sigmoidoscopa. Durante esta prueba, se utiliza un tubo flexible con una cmara diminuta (sigmoidoscopio) para examinar el recto y la parte inferior del colon. El sigmoidoscopio se inserta a travs del ano en el recto y la parte inferior del colon. ? Colonoscopa. Durante esta prueba, se utiliza un tubo [place], delgado y flexible con una cmara diminuta (colonoscopio) para examinar todo el colon y Designer, television/film set.  Anlisis de sangre para la deteccin de la hepatitis C.  Anlisis de sangre para la deteccin de la hepatitis B.  Anlisis de enfermedades de transmisin sexual (ETS).  Pruebas de deteccin de la diabetes. Esto se Set designer un control del azcar en la sangre (glucosa) despus de no haber comido durante un periodo de tiempo (ayuno). Es posible que se le realice esta prueba cada 1 a 3 aos.  Mamografa. Se puede realizar cada 1 o 2 aos. Hable con su mdico sobre cundo debe comenzar a Engineer, manufacturing de [place] regular. Esto depende de si tiene antecedentes familiares de cncer de mama o no.  Pruebas de deteccin de cncer relacionado con las mutaciones del BRCA. Es posible que se las deba realizar si tiene antecedentes de cncer de mama, de ovario, de trompas o peritoneal.  Examen plvico y prueba de Papanicolaou. Esto se puede realizar cada 92aos a [place] de los 21aos de edad. A partir de los 30 aos, esto se puede Optometrist cada 5 aos si usted se realiza una prueba de Papanicolaou en combinacin con una prueba de deteccin del virus del papiloma humano (VPH).  Densitometra sea. Esto se realiza para detectar osteoporosis. Se le puede realizar este examen de deteccin si tiene un riesgo alto de  tener osteoporosis. Hable con su mdico para [employer], las opciones de tratamiento y, si corresponde, la necesidad de Optometrist ms pruebas. Vacunas El mdico puede recomendarle que se aplique algunas vacunas, por ejemplo:  Vacuna contra la gripe. Se recomienda aplicarse esta vacuna todos los aos.  Vacuna contra la difteria, ttanos y tos Dietitian (DTPa, DT). Es posible que tenga que aplicarse un refuerzo contra el ttanos y la difteria (DT) cada 10aos.  Vacuna contra la varicela. Es posible que tenga que aplicrsela si no recibi esta vacuna.  Vacuna contra el herpes zster. Es posible que la necesite despus de los 54 aos de edad.  Vacuna contra el sarampin, rubola y paperas (SRP). Es posible que necesite aplicarse al menos una dosis de la vacuna SRP si naci despus de [phone]. Podra tambin necesitar una segunda dosis.  Vacuna antineumoccica conjugada 13 valente (PCV13). Puede necesitar esta vacuna si tiene determinadas enfermedades y no se vacun anteriormente.  Vacuna antineumoccica de polisacridos (PPSV23). Quizs tenga que aplicarse una o dos dosis si fuma o si tiene determinadas afecciones.  Vacuna antimeningoccica. Puede  necesitar esta vacuna si tiene determinadas afecciones.  Vacuna contra la hepatitis A. Es posible que necesite esta vacuna si tiene ciertas afecciones o si viaja o trabaja en lugares en los que podra estar expuesta a la hepatitis A.  Vacuna contra la hepatitis B. Es posible que necesite esta vacuna si tiene ciertas afecciones o si viaja o trabaja en lugares en los que podra estar expuesta a la hepatitis B.  Vacuna contra la Haemophilus influenzae de tipob (Hib). Puede necesitar esta vacuna si tiene determinadas afecciones. Hable con el mdico sobre qu pruebas de deteccin y qu vacunas necesita, y con qu frecuencia las necesita. Esta informacin no tiene Marine scientist el consejo del mdico. Asegrese de hacerle al mdico  cualquier pregunta que tenga. Document Released: 05/31/2016 Document Revised: 05/01/2017 Document Reviewed: 11/18/2014 Elsevier Interactive Patient Education  2019 [employer].

## 2018-03-02 NOTE — Progress Notes (Signed)
Subjective:    Patient ID: Christine Rosales, female    DOB: [birth year], 43 y.o.   MRN: [id number]  HPI  Pt in for physical exam.  Pt is fasting.  Pt had flu vaccine this year. Up to date on tetanus.  Pt is working out 2 times a week. Pt does not smoke. No alcohol.  Pt updated me on chronic problems. Had discussion on each. See ROS.  Recent normal pap.02/26/2018   Review of Systems  Constitutional: Negative for chills, fatigue and fever.  HENT: Negative for congestion.   Eyes: Negative for photophobia and pain.  Respiratory: Negative for apnea, cough, shortness of breath and wheezing.   Cardiovascular: Negative for chest pain and palpitations.  Gastrointestinal: Negative for abdominal pain, diarrhea and nausea.  Musculoskeletal: Positive for myalgias. Negative for back pain and neck pain.       Hx of fibromyalgia.  Pt states prozac helps.   Pt give me update she still has anterior chest pain with movements and lifting arms. In summer reproducible chest pain. Pt states still has similar intermittent pain as before. When twist thorax or lifts arm has pain. Pt has been seen by UC and rheumatoligst. Both gave prednisone but pain still persists. She does note when lies flat on her upper back pain is at times.  Skin: Negative for rash.  Neurological: Negative for dizziness, seizures, syncope, weakness, light-headedness, numbness and headaches.  Hematological: Negative for adenopathy. Does not bruise/bleed easily.  Psychiatric/Behavioral: Negative for behavioral problems. The patient is nervous/anxious.        Pt states takes xanax about 4 times a month. She will take 1/2 of 0.25 mg. Helps a lot.   Past Medical History:  Diagnosis Date  . Anxiety   . Asthma   . Fibromyalgia   . Headache   . Palpitations   . TMJ (dislocation of temporomandibular joint)      Social History   Socioeconomic History  . Marital status: Married    Spouse name: Not on file  . Number of  children: Not on file  . Years of education: Not on file  . Highest education level: Not on file  Occupational History  . Not on file  Social Needs  . Financial resource strain: Not on file  . Food insecurity:    Worry: Not on file    Inability: Not on file  . Transportation needs:    Medical: Not on file    Non-medical: Not on file  Tobacco Use  . Smoking status: Never Smoker  . Smokeless tobacco: Never Used  Substance and Sexual Activity  . Alcohol use: No  . Drug use: No  . Sexual activity: Yes    Comment: husband has vascetomy  Lifestyle  . Physical activity:    Days per week: Not on file    Minutes per session: Not on file  . Stress: Not on file  Relationships  . Social connections:    Talks on phone: Not on file    Gets together: Not on file    Attends religious service: Not on file    Active member of club or organization: Not on file    Attends meetings of clubs or organizations: Not on file    Relationship status: Not on file  . Intimate partner violence:    Fear of current or ex partner: Not on file    Emotionally abused: Not on file    Physically abused: Not on file  Forced sexual activity: Not on file  Other Topics Concern  . Not on file  Social History Narrative   Born and raised in [place] by parents. Pt has one older sister. Mom suffered from depression and it affected how she raised the pt. Pt has a college degree in Associate Emergency Medicine. Pt moved to [place] in 2009 due to her husband's job. Pt has bee married 14 yrs and has 2 kids. 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     Past Surgical History:  Procedure Laterality Date  . NO PAST SURGERIES      Family History  Problem Relation Age of Onset  . Depression Mother   . Stroke Mother   . Hypertension Mother   . Depression Sister   . Heart murmur Sister   . Hypertension Father     Allergies  Allergen Reactions  . Sulfa Antibiotics Other (See  Comments), Palpitations and Rash    Syncope Syncope Rapid heartrate Syncope Syncope  . Codeine Rash  . Sudafed [Pseudoephedrine] Rash    Current Outpatient Medications on File Prior to Visit  Medication Sig Dispense Refill  . albuterol (PROVENTIL HFA;VENTOLIN HFA) 108 (90 Base) MCG/ACT inhaler Inhale 2 puffs into the lungs every 6 (six) hours as needed for wheezing or shortness of breath. Can give proair. 1 Inhaler 2  . ALPRAZolam (XANAX) 0.25 MG tablet Take 0.25 mg by mouth.    Marland Kitchen atenolol (TENORMIN) 25 MG tablet Take 1 tablet daily as needed for palpitations 30 tablet 2  . baclofen (LIORESAL) 10 MG tablet Take 1 tablet (10 mg total) by mouth 2 (two) times daily. Reported on 06/09/2015 60 each 00  . cefdinir (OMNICEF) 300 MG capsule Take 300 mg by mouth 2 (two) times daily.    . diclofenac (VOLTAREN) 75 MG EC tablet Take 1 tablet (75 mg total) by mouth 2 (two) times daily. 30 tablet 1  . estradiol (VIVELLE-DOT) 0.0375 MG/24HR Place 1 patch onto the skin once a week.    Marland Kitchen FLUoxetine (PROZAC) 20 MG tablet Take 20 mg by mouth daily.    Marland Kitchen gabapentin (NEURONTIN) 100 MG capsule Take 1 capsule (100 mg total) by mouth 3 (three) times daily. 90 capsule 0  . hydrOXYzine (ATARAX/VISTARIL) 25 MG tablet Take 25 mg by mouth 3 (three) times daily as needed (severe anxiety or panic attack).    Marland Kitchen omeprazole (PRILOSEC) 20 MG capsule Take 1 capsule (20 mg total) by mouth 2 (two) times daily before a meal. 60 capsule 3  . progesterone (PROMETRIUM) 100 MG capsule Take 100 mg by mouth daily.     No current facility-administered medications on file prior to visit.     BP 134/88   Pulse 72   Temp 98.1 F (36.7 C) (Oral)   Resp 16   Ht 5\' 4"  (1.626 m)   Wt 132 lb 12.8 oz (60.2 kg)   SpO2 100%   BMI 22.80 kg/m       Objective:   Physical Exam   General Mental Status- Alert. General Appearance- Not in acute distress.   Skin General: Color- Normal Color. Moisture- Normal Moisture.  Neck Carotid  Arteries- Normal color. Moisture- Normal Moisture. No carotid bruits. No JVD.  Chest and Lung Exam Auscultation: Breath Sounds:-Normal.  Cardiovascular Auscultation:Rythm- Regular. Murmurs & Other Heart Sounds:Auscultation of the heart reveals- No Murmurs.  Abdomen Inspection:-Inspeection Normal. Palpation/Percussion:Note:No mass. Palpation and Percussion of the abdomen reveal- Non Tender, Non Distended +  BS, no rebound or guarding.  Anterior chest- rt side costochandral junction tenderness.   Neurologic Cranial Nerve exam:- CN III-XII intact(No nystagmus), symmetric smile. Normal/Intact Strength:- 5/5 equal and symmetric strength both upper and lower extremities.     Assessment & Plan:  For you wellness exam today I have ordered cbc,cmp, lipid panel, and ua.  Vaccine up to date.  Recommend exercise and healthy diet.  We will let you know lab results as they come in.  Follow up date appointment will be determined after lab review.   For history of chronic intermittent anxiety, I did send prescription of benzodiazepine to your pharmacy.  You signed controlled medication contract and will get UDS today.  You do also have some occasional insomnia and find hydroxyzine useful so I did send in prescription of that as well.  For history of palpitations, I did refill your beta-blocker.  For myalgia did refill your Prozac.  For your intermittent signs symptoms that seem consistent with costochondritis, we did give you Toradol 30 mg IM injection today.  I want to see if this subsides/stop your pain and for how long if it does help.  Various work-ups with specialist revealed no cause and treatment failure with prednisone.  The pain does not seem cardiac either.  After get update from you on Monday or Tuesday needed with Toradol might refer you to physical therapy to get their opinion on this chronic costochondral junction pain on the right side.  Follow-up date to be determined after  lab review.  Did wellness exam but also charged [phone] level as did address and discussed various issues today and treated.

## 2018-03-03 LAB — PAIN MGMT, PROFILE 8 W/CONF, U
6 ACETYLMORPHINE: NEGATIVE ng/mL (ref <10)
ALCOHOL METABOLITES: NEGATIVE ng/mL (ref <500)
Amphetamines: NEGATIVE ng/mL (ref <500)
BENZODIAZEPINES: NEGATIVE ng/mL (ref <100)
BUPRENORPHINE, URINE: NEGATIVE ng/mL (ref <5)
Cocaine Metabolite: NEGATIVE ng/mL (ref <150)
Creatinine: 119.5 mg/dL
MARIJUANA METABOLITE: NEGATIVE ng/mL (ref <20)
MDMA: NEGATIVE ng/mL (ref <500)
OPIATES: NEGATIVE ng/mL (ref <100)
Oxidant: NEGATIVE ug/mL (ref <200)
Oxycodone: NEGATIVE ng/mL (ref <100)
pH: 7 (ref 4.5–9.0)

## 2018-03-10 ENCOUNTER — Encounter: Payer: Self-pay | Admitting: Medical

## 2018-03-12 ENCOUNTER — Telehealth: Payer: Self-pay | Admitting: Medical

## 2018-03-12 MED ORDER — ATORVASTATIN CALCIUM 10 MG PO TABS: 10.0000 mg | ORAL_TABLET | Freq: Every day | ORAL | 3 refills | Status: DC

## 2018-03-12 NOTE — Telephone Encounter (Signed)
Rx atorvastatin sent to pt pharmacy. Reviewed labs. Contact pt through my chart. Notified of results on my chart. Somehow no lab result not made. Lab did not come to me initially? Pt sent me message asking for results.

## 2018-05-16 ENCOUNTER — Ambulatory Visit (INDEPENDENT_AMBULATORY_CARE_PROVIDER_SITE_OTHER): Payer: No Typology Code available for payment source | Admitting: Medical

## 2018-05-16 ENCOUNTER — Other Ambulatory Visit: Payer: Self-pay

## 2018-05-16 ENCOUNTER — Encounter: Payer: Self-pay | Admitting: Medical

## 2018-05-16 DIAGNOSIS — R300 Dysuria: Secondary | ICD-10-CM

## 2018-05-16 MED ORDER — CIPROFLOXACIN HCL 500 MG PO TABS: 500.0000 mg | ORAL_TABLET | Freq: Two times a day (BID) | ORAL | 0 refills | Status: DC

## 2018-05-16 NOTE — Progress Notes (Signed)
   Subjective:    Patient ID: Christine Rosales, female    DOB: 01/09/1976, 43 y.o.   MRN: [id number]  HPI    Virtual Visit via Video Note  I connected with [name] on 05/16/18 at  2:40 PM EDT by a video enabled telemedicine application and verified that I am speaking with the correct person using two identifiers.   I discussed the limitations of evaluation and management by telemedicine and the availability of in person appointments. The patient expressed understanding and agreed to proceed.  History of Present Illness: 14 days on and off dysuria, suprapubic pressure and metal odor to urine. Some bilateral mild cva area pain. Mild cva tenderness. Hx of occasional uti in past.   Pt state impossible that she is pregnant indicates not active and premature menopause.  No dc.   Observations/Objective: No acute distress. But notes when presses suprapubic area and over cva area mild pain.  Assessment and Plan: Your appear to have a urinary tract infection. I am prescribing  cipro antibiotic for the probable infection. Hydrate well. I am sending out a urine culture. During the interim if your signs and symptoms worsen rather than improving please notify us. We will notify your when the culture results are back.  Drop urine off tomorrow at 8 am. Start cipro after urine dropped off.  Follow up in 7 days or as needed.  Follow Up Instructions:    I discussed the assessment and treatment plan with the patient. The patient was provided an opportunity to ask questions and all were answered. The patient agreed with the plan and demonstrated an understanding of the instructions.   The patient was advised to call back or seek an in-person evaluation if the symptoms worsen or if the condition fails to improve as anticipated.  I provided  20 minutes of non-face-to-face time during this encounter.   Mackie Pai, PA-C     Review of Systems  Constitutional: Positive for  chills. Negative for fatigue and fever.  HENT: Negative for dental problem.   Respiratory: Negative for cough, choking and wheezing.   Cardiovascular: Negative for chest pain and palpitations.  Gastrointestinal: Negative for abdominal distention, constipation and rectal pain.       Suprapubic pain.  Genitourinary: Positive for difficulty urinating, dysuria and urgency. Negative for vaginal discharge and vaginal pain.  Musculoskeletal: Negative for back pain and neck pain.  Skin: Negative for rash.  Neurological: Negative for dizziness and headaches.  Psychiatric/Behavioral: Negative for behavioral problems, decreased concentration, dysphoric mood and sleep disturbance. The patient is not nervous/anxious.        Objective:   Physical Exam See objective.       Assessment & Plan:

## 2018-05-16 NOTE — Patient Instructions (Signed)
Your appear to have a urinary tract infection. I am prescribing  cipro antibiotic for the probable infection. Hydrate well. I am sending out a urine culture. During the interim if your signs and symptoms worsen rather than improving please notify us. We will notify your when the culture results are back.  Drop urine off tomorrow at 8 am. Start cipro after urine dropped off.  Follow up in 7 days or as needed.

## 2018-05-17 ENCOUNTER — Telehealth (INDEPENDENT_AMBULATORY_CARE_PROVIDER_SITE_OTHER): Payer: No Typology Code available for payment source | Admitting: Medical

## 2018-05-17 ENCOUNTER — Other Ambulatory Visit: Payer: No Typology Code available for payment source

## 2018-05-17 ENCOUNTER — Telehealth: Payer: Self-pay | Admitting: *Deleted

## 2018-05-17 DIAGNOSIS — R300 Dysuria: Secondary | ICD-10-CM | POA: Diagnosis not present

## 2018-05-17 LAB — POC URINALSYSI DIPSTICK (AUTOMATED)
Glucose, UA: NEGATIVE
Nitrite, UA: NEGATIVE
Protein, UA: POSITIVE — AB
Spec Grav, UA: >=1.03 — AB (ref 1.010–1.025)
Urobilinogen, UA: 0.2 E.U./dL
pH, UA: 5 (ref 5.0–8.0)

## 2018-05-17 NOTE — Telephone Encounter (Signed)
I put order in today. Just now.

## 2018-05-17 NOTE — Telephone Encounter (Signed)
Pt presented to the lab but there are no future orders in Epic. Office note from yesterday said urine culture would be checked. Can you please place appropriate future orders? Thanks!

## 2018-05-19 LAB — URINE CULTURE
MICRO NUMBER:: 400529
SPECIMEN QUALITY:: ADEQUATE

## 2018-05-23 ENCOUNTER — Telehealth: Payer: Self-pay | Admitting: Medical

## 2018-05-23 MED ORDER — AMOXICILLIN-POT CLAVULANATE 875-125 MG PO TABS: 1.0000 | ORAL_TABLET | Freq: Two times a day (BID) | ORAL | 0 refills | Status: DC

## 2018-05-23 NOTE — Telephone Encounter (Signed)
Rx augmentin sent to pt pharmacy for uti.

## 2018-07-06 ENCOUNTER — Telehealth: Payer: Self-pay

## 2018-07-06 DIAGNOSIS — E785 Hyperlipidemia, unspecified: Secondary | ICD-10-CM

## 2018-07-06 NOTE — Telephone Encounter (Signed)
I put in cmp and lipid panel future orders. Ask her to do fasting and you could go ahead and schedule her since has been January since last done. Or if she wasts to wait mid July that would work. I would advise her to get back on tenormin since that is b-blocker and controls heart rate and prevents palpitation.

## 2018-07-06 NOTE — Telephone Encounter (Signed)
Copied from [place] [id number]. Topic: General - Other >> Jul 05, 2018 10:54 AM Leward Quan A wrote: Reason for CRM: Patient called to say that Dr Harvie Heck wanted her to come in three months for blood work also say that she stopped taking medication for tachycardia because she have not had blood checked since January. Please call patient to advise on what Dr Harvie Heck want her to do. Ph# [phone]

## 2018-07-06 NOTE — Addendum Note (Signed)
Addended by: Anabel Halon on: 07/06/2018 02:14 PM   Modules accepted: Orders

## 2018-07-13 ENCOUNTER — Other Ambulatory Visit (INDEPENDENT_AMBULATORY_CARE_PROVIDER_SITE_OTHER): Payer: No Typology Code available for payment source

## 2018-07-13 ENCOUNTER — Other Ambulatory Visit: Payer: Self-pay

## 2018-07-13 DIAGNOSIS — E785 Hyperlipidemia, unspecified: Secondary | ICD-10-CM | POA: Diagnosis not present

## 2018-07-13 LAB — LIPID PANEL
Cholesterol: 231 mg/dL — ABNORMAL HIGH (ref 0–200)
HDL: 45.2 mg/dL (ref >39.00)
LDL Cholesterol: 167 mg/dL — ABNORMAL HIGH (ref 0–99)
NonHDL: 185.75
Total CHOL/HDL Ratio: 5
Triglycerides: 93 mg/dL (ref 0.0–149.0)
VLDL: 18.6 mg/dL (ref 0.0–40.0)

## 2018-07-13 LAB — COMPREHENSIVE METABOLIC PANEL
ALT: 8 U/L (ref 0–35)
AST: 13 U/L (ref 0–37)
Albumin: 4.4 g/dL (ref 3.5–5.2)
Alkaline Phosphatase: 70 U/L (ref 39–117)
BUN: 14 mg/dL (ref 6–23)
CO2: 30 mEq/L (ref 19–32)
Calcium: 9.4 mg/dL (ref 8.4–10.5)
Chloride: 102 mEq/L (ref 96–112)
Creatinine, Ser: 0.85 mg/dL (ref 0.40–1.20)
GFR: 73.7 mL/min (ref >60.00)
Glucose, Bld: 89 mg/dL (ref 70–99)
Potassium: 4.1 mEq/L (ref 3.5–5.1)
Sodium: 141 mEq/L (ref 135–145)
Total Bilirubin: 0.7 mg/dL (ref 0.2–1.2)
Total Protein: 6.9 g/dL (ref 6.0–8.3)

## 2018-07-16 ENCOUNTER — Telehealth: Payer: Self-pay | Admitting: Medical

## 2018-07-16 MED ORDER — EZETIMIBE 10 MG PO TABS: 10.0000 mg | ORAL_TABLET | Freq: Every day | ORAL | 3 refills | Status: AC

## 2018-07-16 NOTE — Telephone Encounter (Signed)
-----   Message from Jiles Prows, [place] sent at 07/16/2018  8:46 AM EDT ----- Per patient she has never been on Zetia before, please send rx to her pharmacy.

## 2018-07-16 NOTE — Telephone Encounter (Signed)
Dc lipitor due to side effects.

## 2018-07-16 NOTE — Telephone Encounter (Signed)
Rx zetia sent to pt pharmacy. 

## 2018-10-31 ENCOUNTER — Emergency Department (HOSPITAL_BASED_OUTPATIENT_CLINIC_OR_DEPARTMENT_OTHER): Payer: No Typology Code available for payment source

## 2018-10-31 ENCOUNTER — Other Ambulatory Visit: Payer: Self-pay

## 2018-10-31 ENCOUNTER — Encounter (HOSPITAL_BASED_OUTPATIENT_CLINIC_OR_DEPARTMENT_OTHER): Payer: Self-pay | Admitting: Emergency Medicine

## 2018-10-31 ENCOUNTER — Emergency Department (HOSPITAL_BASED_OUTPATIENT_CLINIC_OR_DEPARTMENT_OTHER)
Admission: EM | Admit: 2018-10-31 | Discharge: 2018-10-31 | Disposition: A | Discharge status: Home / Self Care | Payer: No Typology Code available for payment source | Attending: Emergency Medicine | Admitting: Emergency Medicine

## 2018-10-31 DIAGNOSIS — D219 Benign neoplasm of connective and other soft tissue, unspecified: Secondary | ICD-10-CM | POA: Diagnosis not present

## 2018-10-31 DIAGNOSIS — N760 Acute vaginitis: Secondary | ICD-10-CM | POA: Diagnosis not present

## 2018-10-31 DIAGNOSIS — B9689 Other specified bacterial agents as the cause of diseases classified elsewhere: Secondary | ICD-10-CM | POA: Diagnosis not present

## 2018-10-31 DIAGNOSIS — R1030 Lower abdominal pain, unspecified: Secondary | ICD-10-CM | POA: Diagnosis present

## 2018-10-31 DIAGNOSIS — Z79899 Other long term (current) drug therapy: Secondary | ICD-10-CM | POA: Diagnosis not present

## 2018-10-31 DIAGNOSIS — R102 Pelvic and perineal pain: Secondary | ICD-10-CM

## 2018-10-31 DIAGNOSIS — J45909 Unspecified asthma, uncomplicated: Secondary | ICD-10-CM | POA: Diagnosis not present

## 2018-10-31 LAB — CBC WITH DIFFERENTIAL/PLATELET
Abs Immature Granulocytes: 0.02 10*3/uL (ref 0.00–0.07)
Basophils Absolute: 0 10*3/uL (ref 0.0–0.1)
Basophils Relative: 1 %
Eosinophils Absolute: 0.1 10*3/uL (ref 0.0–0.5)
Eosinophils Relative: 1 %
HCT: 40.3 % (ref 36.0–46.0)
Hemoglobin: 13.5 g/dL (ref 12.0–15.0)
Immature Granulocytes: 0 %
Lymphocytes Relative: 40 %
Lymphs Abs: 3.3 10*3/uL (ref 0.7–4.0)
MCH: 30.7 pg (ref 26.0–34.0)
MCHC: 33.5 g/dL (ref 30.0–36.0)
MCV: 91.6 fL (ref 80.0–100.0)
Monocytes Absolute: 0.7 10*3/uL (ref 0.1–1.0)
Monocytes Relative: 9 %
Neutro Abs: 4.1 10*3/uL (ref 1.7–7.7)
Neutrophils Relative %: 49 %
Platelets: 312 10*3/uL (ref 150–400)
RBC: 4.4 MIL/uL (ref 3.87–5.11)
RDW: 12.6 % (ref 11.5–15.5)
WBC: 8.3 10*3/uL (ref 4.0–10.5)
nRBC: 0 % (ref 0.0–0.2)

## 2018-10-31 LAB — WET PREP, GENITAL
Sperm: NONE SEEN
Trich, Wet Prep: NONE SEEN
Yeast Wet Prep HPF POC: NONE SEEN

## 2018-10-31 LAB — COMPREHENSIVE METABOLIC PANEL
ALT: 13 U/L (ref 0–44)
AST: 16 U/L (ref 15–41)
Albumin: 4 g/dL (ref 3.5–5.0)
Alkaline Phosphatase: 75 U/L (ref 38–126)
Anion gap: 10 (ref 5–15)
BUN: 16 mg/dL (ref 6–20)
CO2: 25 mmol/L (ref 22–32)
Calcium: 8.9 mg/dL (ref 8.9–10.3)
Chloride: 101 mmol/L (ref 98–111)
Creatinine, Ser: 0.75 mg/dL (ref 0.44–1.00)
GFR calc Af Amer: >60 mL/min (ref >60)
GFR calc non Af Amer: >60 mL/min (ref >60)
Glucose, Bld: 102 mg/dL — ABNORMAL HIGH (ref 70–99)
Potassium: 3.5 mmol/L (ref 3.5–5.1)
Sodium: 136 mmol/L (ref 135–145)
Total Bilirubin: 0.7 mg/dL (ref 0.3–1.2)
Total Protein: 7.1 g/dL (ref 6.5–8.1)

## 2018-10-31 LAB — URINALYSIS, ROUTINE W REFLEX MICROSCOPIC
Bilirubin Urine: NEGATIVE
Glucose, UA: NEGATIVE mg/dL
Ketones, ur: NEGATIVE mg/dL
Leukocytes,Ua: NEGATIVE
Nitrite: NEGATIVE
Protein, ur: NEGATIVE mg/dL
Specific Gravity, Urine: >1.03 — ABNORMAL HIGH (ref 1.005–1.030)
pH: 5.5 (ref 5.0–8.0)

## 2018-10-31 LAB — URINALYSIS, MICROSCOPIC (REFLEX)

## 2018-10-31 LAB — HIV ANTIBODY (ROUTINE TESTING W REFLEX): HIV Screen 4th Generation wRfx: NONREACTIVE

## 2018-10-31 LAB — PREGNANCY, URINE: Preg Test, Ur: NEGATIVE

## 2018-10-31 MED ORDER — SODIUM CHLORIDE 0.9 % IV BOLUS
1000.0000 mL | Freq: Once | INTRAVENOUS | Status: AC
  Administered 2018-10-31: 20:00:00 1000 mL via INTRAVENOUS

## 2018-10-31 MED ORDER — KETOROLAC TROMETHAMINE 15 MG/ML IJ SOLN
15.0000 mg | Freq: Once | INTRAMUSCULAR | Status: AC
  Administered 2018-10-31: 15 mg via INTRAVENOUS
  Filled 2018-10-31: qty 1

## 2018-10-31 MED ORDER — METRONIDAZOLE 500 MG PO TABS: 500.0000 mg | ORAL_TABLET | Freq: Two times a day (BID) | ORAL | 0 refills | Status: DC

## 2018-10-31 NOTE — ED Triage Notes (Signed)
Very low RLQ pain and low back pain this week.  Severe Headache x2 days.  No period since June. Sts she was told she is "prematurely menopausal".  Was told at [facility] that she had 2 "faintly positive" urine pregnancy tests today and they wanted her to come here to r/o ectopic.

## 2018-10-31 NOTE — ED Provider Notes (Signed)
[facility] Provider Note   CSN: [id number] Arrival date & time: 10/31/18  1733     History   Chief Complaint Chief Complaint  Patient presents with   Abdominal Pain    HPI Christine Rosales is a 43 y.o. female with a history of anxiety, asthma, fibromyalgia, TMJ, and headaches who presents to the ED from [facility] for evaluation of abdominal pain x 1 weeks. Patient states she has been having progressively worsening waxing/waning R sided pelvic & back pain. States pain is mild with intermittent sharp increases. Alleviated some with ibuprofen, no aggravating factors. Had some brown vaginal discharge/bleeding a few days ago that has resolved. LMP was in June 2020. Sexually active in a monogamous relationship with her husband, she is not concerned for STDs. She also has had a headache- gradual onset, steady progression, similar to prior headaches, aching to the generalized head. Seen at [facility] and had questionable positive pregnancy test therefore was referred to the ED. Denies fever, chills, nausea, vomiting, dysuria, urgency, frequency, diarrhea, visual disturbance, dizziness, numbness, or weakness.  Offered translator, patient declined, able to communicate appropriately in [place].       HPI  Past Medical History:  Diagnosis Date   Anxiety    Asthma    Fibromyalgia    Headache    Palpitations    TMJ (dislocation of temporomandibular joint)     Patient Active Problem List   Diagnosis Date Noted   GAD (generalized anxiety disorder) 04/07/2015   Panic disorder without agoraphobia 04/07/2015   Anxiety state 02/27/2015   Palpitations 02/27/2015   Fibromyalgia 02/27/2015   TMJ crepitus 02/27/2015    Past Surgical History:  Procedure Laterality Date   NO PAST SURGERIES       OB History   No obstetric history on file.      Home Medications    Prior to Admission medications   Medication Sig Start Date End Date Taking? Authorizing  Provider  albuterol (PROVENTIL HFA;VENTOLIN HFA) 108 (90 Base) MCG/ACT inhaler Inhale 2 puffs into the lungs every 6 (six) hours as needed for wheezing or shortness of breath. Can give proair. 03/08/17   Saguier, Percell Miller, PA-C  ALPRAZolam Duanne Moron) 0.25 MG tablet 1 tab daily if needed 03/02/18   Saguier, Percell Miller, PA-C  amoxicillin-clavulanate (AUGMENTIN) 875-125 MG tablet Take 1 tablet by mouth 2 (two) times daily. 05/23/18   Saguier, Percell Miller, PA-C  atenolol (TENORMIN) 25 MG tablet Take 1 tablet daily as needed for palpitations 03/02/18   Saguier, Percell Miller, PA-C  ciprofloxacin (CIPRO) 500 MG tablet Take 1 tablet (500 mg total) by mouth 2 (two) times daily. 05/16/18   Saguier, Percell Miller, PA-C  ezetimibe (ZETIA) 10 MG tablet Take 1 tablet (10 mg total) by mouth daily. 07/16/18   Saguier, Percell Miller, PA-C    Family History Family History  Problem Relation Age of Onset   Depression Mother    Stroke Mother    Hypertension Mother    Depression Sister    Heart murmur Sister    Hypertension Father     Social History Social History   Tobacco Use   Smoking status: Never Smoker   Smokeless tobacco: Never Used  Substance Use Topics   Alcohol use: No   Drug use: No     Allergies   Sulfa antibiotics, Codeine, and Sudafed [pseudoephedrine]   Review of Systems Review of Systems  Constitutional: Negative for chills and fever.  Eyes: Negative for visual disturbance.  Respiratory: Negative for shortness of breath.  Cardiovascular: Negative for chest pain.  Gastrointestinal: Negative for blood in stool, constipation, diarrhea, nausea and vomiting.  Genitourinary: Positive for vaginal bleeding (resolved @ present) and vaginal discharge (resolved @ present). Negative for dysuria.  Neurological: Positive for headaches. Negative for dizziness, tremors, seizures, syncope, facial asymmetry, speech difficulty, weakness, light-headedness and numbness.  All other systems reviewed and are  negative.    Physical Exam Updated Vital Signs BP (!) 147/100 (BP Location: Right Arm)    Pulse 97    Temp 98.4 F (36.9 C) (Oral)    Resp 16    Ht 5\' 4"  (1.626 m)    Wt 60.2 kg    LMP  (Within Months) Comment: LMP June 2020   SpO2 100%    BMI 22.80 kg/m   Physical Exam Vitals signs and nursing note reviewed. Exam conducted with a chaperone present.  Constitutional:      General: She is not in acute distress.    Appearance: She is well-developed. She is not toxic-appearing.  HENT:     Head: Normocephalic and atraumatic.     Mouth/Throat:     Pharynx: Oropharynx is clear. Uvula midline.  Eyes:     General:        Right eye: No discharge.        Left eye: No discharge.     Extraocular Movements: Extraocular movements intact.     Conjunctiva/sclera: Conjunctivae normal.     Pupils: Pupils are equal, round, and reactive to light.     Comments: No tenderness over the temporal artery region.  No proptosis.  Neck:     Musculoskeletal: Neck supple. No neck rigidity.  Cardiovascular:     Rate and Rhythm: Normal rate and regular rhythm.  Pulmonary:     Effort: Pulmonary effort is normal. No respiratory distress.     Breath sounds: Normal breath sounds. No wheezing, rhonchi or rales.  Abdominal:     General: There is no distension.     Palpations: Abdomen is soft.     Tenderness: There is abdominal tenderness (R suprapbuic ). There is no right CVA tenderness, left CVA tenderness, guarding or rebound. Negative signs include Murphy's sign, Rovsing's sign, McBurney's sign, psoas sign and obturator sign.  Genitourinary:    Labia:        Right: No lesion.        Left: No lesion.      Vagina: No foreign body. No bleeding.     Cervix: Discharge (yellow to white) present. No friability.     Adnexa:        Right: No mass.         Left: No mass.       Comments: Diffusely uncomfortable throughout bimanual- most prominent in the R adnexa.  Lymphadenopathy:     Cervical: No cervical  adenopathy.  Skin:    General: Skin is warm and dry.     Findings: No rash.  Neurological:     Mental Status: She is alert.     Comments: Clear speech.  CN III through XII grossly intact.  Sensation grossly intact bilateral upper and lower extremities.  5 out of 5 symmetric grip strength.  5 out of 5 strength with plantar dorsiflexion bilaterally.  Normal finger-to-nose.  Negative pronator drift.  Ambulatory with steady gait.  Psychiatric:        Behavior: Behavior normal.     ED Treatments / Results  Labs (all labs ordered are listed, but only abnormal results are displayed)  Labs Reviewed  WET PREP, GENITAL - Abnormal; Notable for the following components:      Result Value   Clue Cells Wet Prep HPF POC PRESENT (*)    WBC, Wet Prep HPF POC MANY (*)    All other components within normal limits  URINALYSIS, ROUTINE W REFLEX MICROSCOPIC - Abnormal; Notable for the following components:   Specific Gravity, Urine >1.030 (*)    Hgb urine dipstick MODERATE (*)    All other components within normal limits  URINALYSIS, MICROSCOPIC (REFLEX) - Abnormal; Notable for the following components:   Bacteria, UA FEW (*)    All other components within normal limits  COMPREHENSIVE METABOLIC PANEL - Abnormal; Notable for the following components:   Glucose, Bld 102 (*)    All other components within normal limits  PREGNANCY, URINE  CBC WITH DIFFERENTIAL/PLATELET  RPR  HIV ANTIBODY (ROUTINE TESTING W REFLEX)  HIV4GL SAVE TUBE  GC/CHLAMYDIA PROBE AMP ([place]) NOT AT [facility]    EKG None  Radiology US Pelvic Complete W Transvaginal And Torsion R/o  Result Date: 10/31/2018 CLINICAL DATA:  43 year old female with right lower quadrant abdominal pain. EXAM: TRANSABDOMINAL AND TRANSVAGINAL ULTRASOUND OF PELVIS DOPPLER ULTRASOUND OF OVARIES TECHNIQUE: Both transabdominal and transvaginal ultrasound examinations of the pelvis were performed. Transabdominal technique was performed for global imaging  of the pelvis including uterus, ovaries, adnexal regions, and pelvic cul-de-sac. It was necessary to proceed with endovaginal exam following the transabdominal exam to visualize the endometrium and ovaries. Color and duplex Doppler ultrasound was utilized to evaluate blood flow to the ovaries. COMPARISON:  Pelvic ultrasound dated 04/22/2017 FINDINGS: Uterus Measurements: 6.3 x 4.2 x 5.4 cm = volume: 74 mL. The uterus is heterogeneous and retroverted. There is a 2.6 x 1.8 x 2.4 cm anterior body intramural fibroid versus less likely an area of focal adenomyosis. Endometrium Thickness: 4 mm.  No focal abnormality visualized. Right ovary Measurements: 2.0 x 1.1 x 1.8 cm = volume: 2.1 mL. Normal appearance/no adnexal mass. Left ovary Measurements: 3.0 x 1.0 x 1.6 cm = volume: 2.5 mL. Normal appearance/no adnexal mass. Pulsed Doppler evaluation of both ovaries demonstrates normal low-resistance arterial and venous waveforms. Other findings No abnormal free fluid. IMPRESSION: 1. Heterogeneous uterus with an anterior body intramural fibroid. 2. Unremarkable ovaries. Electronically Signed   By: Anner Crete M.D.   On: 10/31/2018 21:28    Procedures Procedures (including critical care time)  Medications Ordered in ED Medications  ketorolac (TORADOL) 15 MG/ML injection 15 mg (15 mg Intravenous Given 10/31/18 2021)  sodium chloride 0.9 % bolus 1,000 mL (1,000 mLs Intravenous New Bag/Given 10/31/18 2021)     Initial Impression / Assessment and Plan / ED Course  I have reviewed the triage vital signs and the nursing notes.  Pertinent labs & imaging results that were available during my care of the patient were reviewed by me and considered in my medical decision making (see chart for details).   Patient presents to the emergency department from urgent care for further evaluation of right lower abdomen/pelvic pain, she has also had headache for the past few days.  Patient is nontoxic-appearing, no apparent  distress, vitals WNL with exception of elevated blood pressure, doubt HTN emergency.  Regarding her pelvic/abdominal pain: Patient has right suprapubic tenderness to palpation as well as diffuse tenderness throughout bimanual most prominently in the right adnexa.  No peritoneal signs on abdominal exam.  Pregnancy test in the emergency department is negative therefore doubt ectopic pregnancy.  Urinalysis  with moderate Hgb and elevated specific gravity, no obvious UTI especially without urinary symptoms.  Wet prep with bacterial vaginosis.  GC/chlamydia/HIV/syphilis pending, patient was uncomfortable during bimanual exam, however she states she is in a monogamous relationship and is not concerned for STDs therefore PID is felt to be less likely.  Ultrasound obtained reveals fibroids, unremarkable ovaries, no ovarian cyst or torsion.  CBC and CMP are overall reassuring.  We will proceed with CT renal study to evaluate for nephrolithiasis as well as an obvious appendicitis but this felt to be less likely.  Regarding her headache: . Patient has hx of similar headaches, gradual onset with steady progression in severity, afebrile, no nuchal rigidity, no dizziness, no visual disturbance, no neuro deficits- non concerning for [facility], I[facility], ischemic CVA, dural venous sinus thrombosis, acute glaucoma, giant cell arteritis, mass, or meningitis.  22:20: Patient care signed out to supervising physician Dr. Rex Kras pending CT renal stone study and disposition. Patient aware of results & plan of care thus far, provided opportunity for questions, she has confirmed understanding and is in agreement.  Final Clinical Impressions(s) / ED Diagnoses   Final diagnoses:  Fibroid  Bacterial vaginosis    ED Discharge Orders    None       Amaryllis Dyke, PA-C 10/31/18 2222    Little, Wenda Overland, MD 10/31/18 2312

## 2018-11-01 LAB — GC/CHLAMYDIA PROBE AMP (~~LOC~~) NOT AT ARMC
Chlamydia: NEGATIVE
Molecular Disclaimer: NEGATIVE
Molecular Disclaimer: NORMAL
Neisseria Gonorrhea: NEGATIVE

## 2018-11-01 LAB — RPR: RPR Ser Ql: NONREACTIVE

## 2018-12-01 ENCOUNTER — Other Ambulatory Visit: Payer: Self-pay

## 2018-12-01 DIAGNOSIS — Z20822 Contact with and (suspected) exposure to covid-19: Secondary | ICD-10-CM

## 2018-12-03 LAB — NOVEL CORONAVIRUS, NAA: SARS-CoV-2, NAA: NOT DETECTED

## 2018-12-10 ENCOUNTER — Emergency Department (HOSPITAL_BASED_OUTPATIENT_CLINIC_OR_DEPARTMENT_OTHER)
Admission: EM | Admit: 2018-12-10 | Discharge: 2018-12-10 | Disposition: A | Discharge status: Home / Self Care | Payer: No Typology Code available for payment source | Attending: Emergency Medicine | Admitting: Emergency Medicine

## 2018-12-10 ENCOUNTER — Emergency Department (HOSPITAL_BASED_OUTPATIENT_CLINIC_OR_DEPARTMENT_OTHER): Payer: No Typology Code available for payment source

## 2018-12-10 ENCOUNTER — Encounter (HOSPITAL_BASED_OUTPATIENT_CLINIC_OR_DEPARTMENT_OTHER): Payer: Self-pay | Admitting: Emergency Medicine

## 2018-12-10 ENCOUNTER — Other Ambulatory Visit: Payer: Self-pay

## 2018-12-10 DIAGNOSIS — Z20822 Contact with and (suspected) exposure to covid-19: Secondary | ICD-10-CM

## 2018-12-10 DIAGNOSIS — J069 Acute upper respiratory infection, unspecified: Secondary | ICD-10-CM

## 2018-12-10 DIAGNOSIS — J988 Other specified respiratory disorders: Secondary | ICD-10-CM | POA: Diagnosis not present

## 2018-12-10 DIAGNOSIS — Z79899 Other long term (current) drug therapy: Secondary | ICD-10-CM | POA: Insufficient documentation

## 2018-12-10 DIAGNOSIS — J45909 Unspecified asthma, uncomplicated: Secondary | ICD-10-CM | POA: Diagnosis not present

## 2018-12-10 DIAGNOSIS — R509 Fever, unspecified: Secondary | ICD-10-CM | POA: Diagnosis present

## 2018-12-10 DIAGNOSIS — U071 COVID-19: Secondary | ICD-10-CM | POA: Insufficient documentation

## 2018-12-10 DIAGNOSIS — R319 Hematuria, unspecified: Secondary | ICD-10-CM | POA: Insufficient documentation

## 2018-12-10 DIAGNOSIS — Z20828 Contact with and (suspected) exposure to other viral communicable diseases: Secondary | ICD-10-CM

## 2018-12-10 DIAGNOSIS — R0981 Nasal congestion: Secondary | ICD-10-CM | POA: Insufficient documentation

## 2018-12-10 LAB — SARS CORONAVIRUS 2 (TAT 6-24 HRS): SARS Coronavirus 2: POSITIVE — AB

## 2018-12-10 LAB — PREGNANCY, URINE: Preg Test, Ur: NEGATIVE

## 2018-12-10 LAB — URINALYSIS, ROUTINE W REFLEX MICROSCOPIC
Bilirubin Urine: NEGATIVE
Glucose, UA: NEGATIVE mg/dL
Ketones, ur: NEGATIVE mg/dL
Leukocytes,Ua: NEGATIVE
Nitrite: NEGATIVE
Protein, ur: NEGATIVE mg/dL
Specific Gravity, Urine: >1.03 — ABNORMAL HIGH (ref 1.005–1.030)
pH: 5.5 (ref 5.0–8.0)

## 2018-12-10 LAB — URINALYSIS, MICROSCOPIC (REFLEX)

## 2018-12-10 NOTE — ED Provider Notes (Signed)
[place] [facility] DE[facility] Provider Note   CSN: [id number] Arrival date & time: 12/10/18  1022     History   Chief Complaint Chief Complaint  Patient presents with  . Fever  . Nasal Congestion    HPI Christine Rosales is a 43 y.o. female.  43 year old female here for evaluation of possible Covid symptoms.  She said she had a close contact with a Covid positive friend and went and got tested on Wednesday 5 days ago.  2 days after that she began having fevers to T-max 101 along with headache body aches left-sided chest wall pain with cough.  She has been using Tylenol with intermittent relief.  She still has sense of smell and taste and no vomiting or diarrhea.     The history is provided by the patient.  URI Presenting symptoms: congestion, cough, fatigue and fever   Presenting symptoms: no sore throat   Severity:  Moderate Onset quality:  Gradual Duration:  4 days Timing:  Intermittent Progression:  Unchanged Chronicity:  New Worsened by:  Nothing Ineffective treatments:  None tried Associated symptoms: headaches and myalgias   Associated symptoms: no sinus pain, no sneezing and no wheezing   Risk factors: sick contacts   Risk factors: no chronic respiratory disease and no diabetes mellitus     Past Medical History:  Diagnosis Date  . Anxiety   . Asthma   . Fibromyalgia   . Headache   . Palpitations   . TMJ (dislocation of temporomandibular joint)     Patient Active Problem List   Diagnosis Date Noted  . GAD (generalized anxiety disorder) 04/07/2015  . Panic disorder without agoraphobia 04/07/2015  . Anxiety state 02/27/2015  . Palpitations 02/27/2015  . Fibromyalgia 02/27/2015  . 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 NO PAST SURGERIES       OB History   No obstetric history on file.      Home Medications    Prior to Admission medications   Medication Sig Start Date End Date Taking?  Authorizing Provider  albuterol (PROVENTIL HFA;VENTOLIN HFA) 108 (90 Base) MCG/ACT inhaler Inhale 2 puffs into the lungs every 6 (six) hours as needed for wheezing or shortness of breath. Can give proair. 03/08/17   Saguier, Percell Miller, PA-C  ALPRAZolam Duanne Moron) 0.25 MG tablet 1 tab daily if needed 03/02/18   Saguier, Percell Miller, PA-C  amoxicillin-clavulanate (AUGMENTIN) 875-125 MG tablet Take 1 tablet by mouth 2 (two) times daily. 05/23/18   Saguier, Percell Miller, PA-C  atenolol (TENORMIN) 25 MG tablet Take 1 tablet daily as needed for palpitations 03/02/18   Saguier, Percell Miller, PA-C  ciprofloxacin (CIPRO) 500 MG tablet Take 1 tablet (500 mg total) by mouth 2 (two) times daily. 05/16/18   Saguier, Percell Miller, PA-C  ezetimibe (ZETIA) 10 MG tablet Take 1 tablet (10 mg total) by mouth daily. 07/16/18   Saguier, Percell Miller, PA-C  metroNIDAZOLE (FLAGYL) 500 MG tablet Take 1 tablet (500 mg total) by mouth 2 (two) times daily. 10/31/18   Little, Wenda Overland, MD    Family History Family History  Problem Relation Age of Onset  . Depression Mother   . Stroke Mother   . Hypertension Mother   . Depression Sister   . Heart murmur Sister   . Hypertension Father     Social History Social History   Tobacco Use  . Smoking status: Never Smoker  . Smokeless tobacco: Never Used  Substance Use Topics  .  Alcohol use: No  . Drug use: No     Allergies   Sulfa antibiotics, Codeine, and Sudafed [pseudoephedrine]   Review of Systems Review of Systems  Constitutional: Positive for fatigue and fever.  HENT: Positive for congestion. Negative for sinus pain, sneezing and sore throat.   Eyes: Negative for visual disturbance.  Respiratory: Positive for cough. Negative for shortness of breath and wheezing.   Cardiovascular: Positive for chest pain.  Gastrointestinal: Negative for abdominal pain and diarrhea.  Genitourinary: Negative for dysuria.  Musculoskeletal: Positive for myalgias.  Skin: Negative for rash.  Neurological:  Positive for headaches.     Physical Exam Updated Vital Signs BP (!) 153/106 (BP Location: Right Arm)   Pulse 83   Temp 98.4 F (36.9 C) (Oral)   Resp 14   Wt 65 kg   SpO2 100%   BMI 24.58 kg/m   Physical Exam Vitals signs and nursing note reviewed.  Constitutional:      General: She is not in acute distress.    Appearance: She is well-developed.  HENT:     Head: Normocephalic and atraumatic.  Eyes:     Conjunctiva/sclera: Conjunctivae normal.  Neck:     Musculoskeletal: Neck supple.  Cardiovascular:     Rate and Rhythm: Normal rate and regular rhythm.     Heart sounds: No murmur.  Pulmonary:     Effort: Pulmonary effort is normal. No respiratory distress.     Breath sounds: Normal breath sounds.  Abdominal:     Palpations: Abdomen is soft.     Tenderness: There is no abdominal tenderness.  Musculoskeletal: Normal range of motion.     Right lower leg: No edema.     Left lower leg: No edema.  Skin:    General: Skin is warm and dry.     Capillary Refill: Capillary refill takes less than 2 seconds.  Neurological:     General: No focal deficit present.     Mental Status: She is alert.      ED Treatments / Results  Labs (all labs ordered are listed, but only abnormal results are displayed) Labs Reviewed  URINALYSIS, ROUTINE W REFLEX MICROSCOPIC - Abnormal; Notable for the following components:      Result Value   Specific Gravity, Urine >1.030 (*)    Hgb urine dipstick MODERATE (*)    All other components within normal limits  URINALYSIS, MICROSCOPIC (REFLEX) - Abnormal; Notable for the following components:   Bacteria, UA MANY (*)    All other components within normal limits  SARS CORONAVIRUS 2 (TAT 6-24 HRS)  PREGNANCY, URINE    EKG None  Radiology Dg Chest Port 1 View  Result Date: 12/10/2018 CLINICAL DATA:  Cough, fever EXAM: PORTABLE CHEST 1 VIEW COMPARISON:  September 11, 2017 FINDINGS: The heart size and mediastinal contours are within normal  limits. Both lungs are clear. No pneumothorax or pleural effusion. The visualized skeletal structures are unremarkable. IMPRESSION: No acute process in the chest. Electronically Signed   By: Macy Mis M.D.   On: 12/10/2018 11:02    Procedures Procedures (including critical care time)  Medications Ordered in ED Medications - No data to display   Initial Impression / Assessment and Plan / ED Course  I have reviewed the triage vital signs and the nursing notes.  Pertinent labs & imaging results that were available during my care of the patient were reviewed by me and considered in my medical decision making (see chart for details).  Clinical  Course as of Dec 09 1720  Mon Dec 09, 5780  5343 43 year old female here with possible Covid symptoms.  She is complaining of 4 days of intermittent fevers responsive to Tylenol along with headache body aches chest wall pain with cough.  Differential includes Covid, pneumonia, bronchitis, viral syndrome.  Will check Covid test and chest x-ray and also get a urine.   [MB]  [id number] She is nontoxic-appearing satting 100% on room air.  PERC negative.   [MB]  [id number] Chest x-ray interpreted by me as no gross infiltrates.  Awaiting radiology reading.   [MB]  1220 Patient's urine shows 11-20 reds.  She is not having any urine symptoms and she says she does not have periods for the last 4 years.  Pregnancy test negative.  She clearly does not look like she is having renal colic as she is laying comfortably in bed texting on the phone.  Will discharge and have her follow-up with her PCP regarding this.   [MB]    Clinical Course User Index [MB] Hayden Rasmussen, MD   Christine Rosales was evaluated in Emergency Department on 12/10/2018 for the symptoms described in the history of present illness. She was evaluated in the context of the global COVID-19 pandemic, which necessitated consideration that the patient might be at risk for infection with the SARS-CoV-2  virus that causes COVID-19. Institutional protocols and algorithms that pertain to the evaluation of patients at risk for COVID-19 are in a state of rapid change based on information released by regulatory bodies including the CDC and federal and state organizations. These policies and algorithms were followed during the patient's care in the ED.      Final Clinical Impressions(s) / ED Diagnoses   Final diagnoses:  Upper respiratory tract infection, unspecified type  Person under investigation for COVID-19  Hematuria, unspecified type    ED Discharge Orders    None       Hayden Rasmussen, MD 12/10/18 1722

## 2018-12-10 NOTE — ED Triage Notes (Signed)
Pt c/o congestion, fever, HA, and LT side rib area pain since Fri

## 2018-12-12 ENCOUNTER — Ambulatory Visit: Payer: Self-pay | Admitting: *Deleted

## 2018-12-12 NOTE — Telephone Encounter (Signed)
+ COVID- patient reports fever and dizziness better. Patient is having pain in back and L ribs. Patient has lost smell. Patient states she had hematuria at ED and now she has frequency and flank pain. She still has low grade fever. Call to office- 2x and no answer- call sent for review and scheduling.  Reason for Disposition . [1] SEVERE pain (e.g., excruciating, scale 8-10) AND [2] not improved after pain medicine  Answer Assessment - Initial Assessment Questions 1. LOCATION: "Where does it hurt?"       Left at ribs- not in chest- more abdominal pain 2. RADIATION: "Does the pain go anywhere else?" (e.g., into neck, jaw, arms, back)     Back pain- lower flank 3. ONSET: "When did the chest pain begin?" (Minutes, hours or days)      *No Answer* 4. PATTERN "Does the pain come and go, or has it been constant since it started?"  "Does it get worse with exertion?"      *No Answer* 5. DURATION: "How long does it last" (e.g., seconds, minutes, hours)     *No Answer* 6. SEVERITY: "How bad is the pain?"  (e.g., Scale 1-10; mild, moderate, or severe)    - MILD (1-3): doesn't interfere with normal activities     - MODERATE (4-7): interferes with normal activities or awakens from sleep    - SEVERE (8-10): excruciating pain, unable to do any normal activities       *No Answer* 7. CARDIAC RISK FACTORS: "Do you have any history of heart problems or risk factors for heart disease?" (e.g., angina, prior heart attack; diabetes, high blood pressure, high cholesterol, smoker, or strong family history of heart disease)     *No Answer* 8. PULMONARY RISK FACTORS: "Do you have any history of lung disease?"  (e.g., blood clots in lung, asthma, emphysema, birth control pills)     *No Answer* 9. CAUSE: "What do you think is causing the chest pain?"     *No Answer* 10. OTHER SYMPTOMS: "Do you have any other symptoms?" (e.g., dizziness, nausea, vomiting, sweating, fever, difficulty breathing, cough)       *No  Answer* 11. PREGNANCY: "Is there any chance you are pregnant?" "When was your last menstrual period?"       *No Answer*  Answer Assessment - Initial Assessment Questions 1. ONSET: "When did the pain begin?"      Last Friday 2. LOCATION: "Where does it hurt?" (upper, mid or lower back)     LLQ 3. SEVERITY: "How bad is the pain?"  (e.g., Scale 1-10; mild, moderate, or severe)   - MILD (1-3): doesn't interfere with normal activities    - MODERATE (4-7): interferes with normal activities or awakens from sleep    - SEVERE (8-10): excruciating pain, unable to do any normal activities      8 4. PATTERN: "Is the pain constant?" (e.g., yes, no; constant, intermittent)      constant 5. RADIATION: "Does the pain shoot into your legs or elsewhere?"     no 6. CAUSE:  "What do you think is causing the back pain?"      Urinary vs COVID symptom 7. BACK OVERUSE:  "Any recent lifting of heavy objects, strenuous work or exercise?"     no 8. MEDICATIONS: "What have you taken so far for the pain?" (e.g., nothing, acetaminophen, NSAIDS)     Advil and Tylenol 9. NEUROLOGIC SYMPTOMS: "Do you have any weakness, numbness, or problems with bowel/bladder control?"  Frequency with urination, congestion 10. OTHER SYMPTOMS: "Do you have any other symptoms?" (e.g., fever, abdominal pain, burning with urination, blood in urine)       Left lower side/rib area pain, 99.1 fever 11. PREGNANCY: "Is there any chance you are pregnant?" (e.g., yes, no; LMP)       No- skips cycle 07/2018  Protocols used: FLANK PAIN-A-AH, CHEST PAIN-A-AH, BACK PAIN-A-AH

## 2018-12-12 NOTE — Telephone Encounter (Signed)
LVM in spanish for pt to call the office and schedule a VV at her convenience. Done.

## 2018-12-17 ENCOUNTER — Ambulatory Visit (INDEPENDENT_AMBULATORY_CARE_PROVIDER_SITE_OTHER): Payer: No Typology Code available for payment source | Admitting: Medical

## 2018-12-17 ENCOUNTER — Encounter: Payer: Self-pay | Admitting: Medical

## 2018-12-17 ENCOUNTER — Ambulatory Visit: Payer: Self-pay

## 2018-12-17 ENCOUNTER — Other Ambulatory Visit: Payer: Self-pay

## 2018-12-17 VITALS — BP 136/96 | HR 86 | Temp 97.4°F | Wt 125.0 lb

## 2018-12-17 DIAGNOSIS — U071 COVID-19: Secondary | ICD-10-CM

## 2018-12-17 DIAGNOSIS — R059 Cough, unspecified: Secondary | ICD-10-CM

## 2018-12-17 DIAGNOSIS — R05 Cough: Secondary | ICD-10-CM

## 2018-12-17 DIAGNOSIS — E860 Dehydration: Secondary | ICD-10-CM

## 2018-12-17 MED ORDER — BENZONATATE 100 MG PO CAPS: 100.0000 mg | ORAL_CAPSULE | Freq: Three times a day (TID) | ORAL | 0 refills | Status: DC | PRN

## 2018-12-17 MED ORDER — AZITHROMYCIN 250 MG PO TABS: ORAL_TABLET | ORAL | 0 refills | Status: DC

## 2018-12-17 NOTE — Patient Instructions (Signed)
Patient does have recent Covid infection.  Some of her symptoms are improving but she does report which are recent cough, fatigue and have significant weight since infection steroids. She has a good saturation numbers.    With new onset productive cough, I do think is best that she go ahead and start azithromycin and prescribed benzonatate for cough.  Keep checking O2 sats daily.  If numbers decreasing towards 90 over you get subjective shortness of breath then advise ED evaluation.  Also be seen in ED if needed worsened in general.  You also have likely dehydration I would recommend that you hydrate well with sugar-free Gatorade or propel fitness water.  Eat bland foods.  To check your weight daily and make sure that your weight is increasing.  If further weight loss then recommend you go to ED for IV hydration.  Follow-up this coming Friday via [place].  Would like update on how you are doing before the weekend.  Patient is aware that she should be at home and quarantine.

## 2018-12-17 NOTE — Telephone Encounter (Signed)
Pt. Reports she tested COVID 19 positive last week and would like an appointment. Has cough, fatigue, dizziness, poor appetite. Warm transfer to [facility] in the practice for a visit. Answer Assessment - Initial Assessment Questions 1. COVID-19 DIAGNOSIS: "Who made your Coronavirus (COVID-19) diagnosis?" "Was it confirmed by a positive lab test?" If not diagnosed by a HCP, ask "Are there lots of cases (community spread) where you live?" (See public health department website, if unsure)     COVID 19 positive 2. ONSET: "When did the COVID-19 symptoms start?"      Last week 3. WORST SYMPTOM: "What is your worst symptom?" (e.g., cough, fever, shortness of breath, muscle aches)     Fatigue, dizziness, poor appetite  4. COUGH: "Do you have a cough?" If so, ask: "How bad is the cough?"       Coughs at night 5. FEVER: "Do you have a fever?" If so, ask: "What is your temperature, how was it measured, and when did it start?"     No fever today 6. RESPIRATORY STATUS: "Describe your breathing?" (e.g., shortness of breath, wheezing, unable to speak)      No 7. BETTER-SAME-WORSE: "Are you getting better, staying the same or getting worse compared to yesterday?"  If getting worse, ask, "In what way?"     Better 8. HIGH RISK DISEASE: "Do you have any chronic medical problems?" (e.g., asthma, heart or lung disease, weak immune system, etc.)     No 9. PREGNANCY: "Is there any chance you are pregnant?" "When was your last menstrual period?"     No 10. OTHER SYMPTOMS: "Do you have any other symptoms?"  (e.g., chills, fatigue, headache, loss of smell or taste, muscle pain, sore throat)       Fatigue, dizziness  Protocols used: CORONAVIRUS (COVID-19) DIAGNOSED OR SUSPECTED-A-AH

## 2018-12-17 NOTE — Telephone Encounter (Signed)
Appt scheduled

## 2018-12-17 NOTE — Progress Notes (Signed)
Subjective:    Patient ID: Christine Rosales, female    DOB: 1975/05/02, 43 y.o.   MRN: [id number]  HPI  Virtual Visit via Video Note  I connected with Kaeden Duce on 12/17/18 at 11:00 AM EST by a video enabled telemedicine application and verified that I am speaking with the correct person using two identifiers.  Location: Patient: home Provider: office   I discussed the limitations of evaluation and management by telemedicine and the availability of in person appointments. The patient expressed understanding and agreed to proceed.  History of Present Illness:   Pt had positive covid. 1st day had fever and fatigue on November 6. On 11 th sense of smell left. Now can smell. Fever no longer. Recent started coughing with mild phlem. Mild dizzy and fatigue. Pt is trying to hydrate. No wheezing.  No sob. Pt has 02 sat at home. Has 97%.   Pt can walk in he house without sob but state fatigue. No leg pain.   Observations/Objective:   General- no acute distress, pleasant, oriented and normal speech   Assessment and Plan: Patient does have recent Covid infection.  Some of her symptoms are improving but she does report which are recent cough, fatigue and have significant weight since infection steroids. She has a good saturation numbers.    With new onset productive cough, I do think is best that she go ahead and start azithromycin and prescribed benzonatate for cough.  Keep checking O2 sats daily.  If numbers decreasing towards 90 over you get subjective shortness of breath then advise ED evaluation.  Also be seen in ED if needed worsened in general.  You also have likely dehydration I would recommend that you hydrate well with sugar-free Gatorade or propel fitness water.  Eat bland foods.  To check your weight daily and make sure that your weight is increasing.  If further weight loss then recommend you go to ED for IV hydration.  Follow-up this coming Friday via [place].   Would like update on how you are doing before the weekend.  Patient is aware that she should be at home and quarantine.  Mackie Pai, PA-C  Follow Up Instructions:    I discussed the assessment and treatment plan with the patient. The patient was provided an opportunity to ask questions and all were answered. The patient agreed with the plan and demonstrated an understanding of the instructions.   The patient was advised to call back or seek an in-person evaluation if the symptoms worsen or if the condition fails to improve as anticipated.  I provided 25 minutes of non-face-to-face time during this encounter.   Mackie Pai, PA-C   Review of Systems  Constitutional: Negative for diaphoresis and fever.  HENT: Negative for dental problem.   Respiratory: Positive for cough. Negative for chest tightness, shortness of breath and wheezing.   Cardiovascular: Negative for chest pain and palpitations.  Gastrointestinal: Negative for abdominal distention, blood in stool and diarrhea.       Diarrhea for 2 days. Stopped on sat.  Genitourinary: Negative for difficulty urinating, dysuria, flank pain, frequency, vaginal bleeding and vaginal pain.  Musculoskeletal: Negative for back pain, joint swelling and neck stiffness.  Skin: Negative for rash.  Neurological: Negative for dizziness, speech difficulty, weakness, light-headedness and headaches.  Hematological: Negative for adenopathy. Does not bruise/bleed easily.  Psychiatric/Behavioral: Negative for behavioral problems, dysphoric mood and sleep disturbance. The patient is not nervous/anxious.     Past Medical History:  Diagnosis Date  . Anxiety   . Asthma   . Fibromyalgia   . Headache   . Palpitations   . TMJ (dislocation of temporomandibular joint)      Social History   Socioeconomic History  . Marital status: Married    Spouse name: Not on file  . Number of children: Not on file  . Years of education: Not on file  .  Highest education level: Not on file  Occupational History  . Not on file  Social Needs  . Financial resource strain: Not on file  . Food insecurity    Worry: Not on file    Inability: Not on file  . Transportation needs    Medical: Not on file    Non-medical: Not on file  Tobacco Use  . Smoking status: Never Smoker  . Smokeless tobacco: Never Used  Substance and Sexual Activity  . Alcohol use: No  . Drug use: No  . Sexual activity: Yes    Comment: husband has vascetomy  Lifestyle  . Physical activity    Days per week: Not on file    Minutes per session: Not on file  . Stress: Not on file  Relationships  . Social Herbalist on phone: Not on file    Gets together: Not on file    Attends religious service: Not on file    Active member of club or organization: Not on file    Attends meetings of clubs or organizations: Not on file    Relationship status: Not on file  . Intimate partner violence    Fear of current or ex partner: Not on file    Emotionally abused: Not on file    Physically abused: Not on file    Forced sexual activity: Not on file  Other Topics Concern  . Not on file  Social History Narrative   Born and raised in [place] by parents. Pt has one older sister. Mom suffered from depression and it affected how she raised the pt. Pt has a college degree in Associate Emergency Medicine. Pt moved to [place] in 2009 due to her husband's job. Pt has bee married 14 yrs and has 2 kids. 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     Past Surgical History:  Procedure Laterality Date  . NO PAST SURGERIES      Family History  Problem Relation Age of Onset  . Depression Mother   . Stroke Mother   . Hypertension Mother   . Depression Sister   . Heart murmur Sister   . Hypertension Father     Allergies  Allergen Reactions  . Sulfa Antibiotics Other (See Comments), Palpitations and Rash    Syncope Syncope Rapid heartrate  Syncope Syncope  . Codeine Rash  . Sudafed [Pseudoephedrine] Rash    Current Outpatient Medications on File Prior to Visit  Medication Sig Dispense Refill  . albuterol (PROVENTIL HFA;VENTOLIN HFA) 108 (90 Base) MCG/ACT inhaler Inhale 2 puffs into the lungs every 6 (six) hours as needed for wheezing or shortness of breath. Can give proair. 1 Inhaler 2  . ALPRAZolam (XANAX) 0.25 MG tablet 1 tab daily if needed 30 tablet 0  . amoxicillin-clavulanate (AUGMENTIN) 875-125 MG tablet Take 1 tablet by mouth 2 (two) times daily. 14 tablet 0  . atenolol (TENORMIN) 25 MG tablet Take 1 tablet daily as needed for palpitations 30 tablet 2  . ciprofloxacin (CIPRO) 500 MG  tablet Take 1 tablet (500 mg total) by mouth 2 (two) times daily. 14 tablet 0  . ezetimibe (ZETIA) 10 MG tablet Take 1 tablet (10 mg total) by mouth daily. 30 tablet 3  . metroNIDAZOLE (FLAGYL) 500 MG tablet Take 1 tablet (500 mg total) by mouth 2 (two) times daily. 14 tablet 0   No current facility-administered medications on file prior to visit.     BP (!) 136/96   Pulse 86   Temp (!) 97.4 F (36.3 C) (Oral)   Wt 125 lb (56.7 kg)   SpO2 98%   BMI 21.46 kg/m       Objective:   Physical Exam        Assessment & Plan:

## 2019-01-04 ENCOUNTER — Other Ambulatory Visit: Payer: Self-pay

## 2019-01-04 DIAGNOSIS — Z20822 Contact with and (suspected) exposure to covid-19: Secondary | ICD-10-CM

## 2019-01-05 LAB — NOVEL CORONAVIRUS, NAA: SARS-CoV-2, NAA: NOT DETECTED

## 2019-01-09 ENCOUNTER — Ambulatory Visit (INDEPENDENT_AMBULATORY_CARE_PROVIDER_SITE_OTHER): Payer: No Typology Code available for payment source | Admitting: Medical

## 2019-01-09 ENCOUNTER — Other Ambulatory Visit: Payer: Self-pay

## 2019-01-09 VITALS — BP 139/87 | HR 90 | Temp 98.6°F | Resp 16 | Wt 129.0 lb

## 2019-01-09 DIAGNOSIS — D649 Anemia, unspecified: Secondary | ICD-10-CM | POA: Diagnosis not present

## 2019-01-09 DIAGNOSIS — Z8619 Personal history of other infectious and parasitic diseases: Secondary | ICD-10-CM | POA: Diagnosis not present

## 2019-01-09 DIAGNOSIS — F411 Generalized anxiety disorder: Secondary | ICD-10-CM

## 2019-01-09 DIAGNOSIS — I10 Essential (primary) hypertension: Secondary | ICD-10-CM | POA: Diagnosis not present

## 2019-01-09 DIAGNOSIS — E785 Hyperlipidemia, unspecified: Secondary | ICD-10-CM | POA: Diagnosis not present

## 2019-01-09 DIAGNOSIS — Z8616 Personal history of COVID-19: Secondary | ICD-10-CM

## 2019-01-09 MED ORDER — ALPRAZOLAM 0.25 MG PO TABS: ORAL_TABLET | ORAL | 0 refills | Status: AC

## 2019-01-09 NOTE — Progress Notes (Signed)
Subjective:    Patient ID: Christine Rosales, female    DOB: 11/29/1975, 43 y.o.   MRN: [id number]  HPI  Pt in for follow updates me that she got covid early November. She feels good now. Pt state persons that person who she thinks she got covid from passed away.  She is sad about the person that passed away/friend from church.   Pt has high blood pressure hx. Her bp is adequate well controlled today but little borderline.   Pt has high cholesterol and is on zetia.  Pt has hx of anxiety. She has not had to use recently. But since death of friend feeling real anxious.  Pt has not had to use any inhaler recently.    Review of Systems  Constitutional: Negative for chills, fatigue and fever.  HENT: Negative for congestion, ear pain, sinus pressure, sinus pain, tinnitus and trouble swallowing.   Respiratory: Negative for chest tightness, shortness of breath and wheezing.   Cardiovascular: Negative for chest pain and palpitations.  Gastrointestinal: Negative for abdominal pain, constipation and nausea.  Musculoskeletal: Negative for back pain and myalgias.  Neurological: Negative for dizziness, weakness, numbness and headaches.  Hematological: Negative for adenopathy. Does not bruise/bleed easily.  Psychiatric/Behavioral: Negative for behavioral problems and confusion.    Past Medical History:  Diagnosis Date  . Anxiety   . Asthma   . Fibromyalgia   . Headache   . Palpitations   . TMJ (dislocation of temporomandibular joint)      Social History   Socioeconomic History  . Marital status: Married    Spouse name: Not on file  . Number of children: Not on file  . Years of education: Not on file  . Highest education level: Not on file  Occupational History  . Not on file  Social Needs  . Financial resource strain: Not on file  . Food insecurity    Worry: Not on file    Inability: Not on file  . Transportation needs    Medical: Not on file    Non-medical: Not on  file  Tobacco Use  . Smoking status: Never Smoker  . Smokeless tobacco: Never Used  Substance and Sexual Activity  . Alcohol use: No  . Drug use: No  . Sexual activity: Yes    Comment: husband has vascetomy  Lifestyle  . Physical activity    Days per week: Not on file    Minutes per session: Not on file  . Stress: Not on file  Relationships  . Social Herbalist on phone: Not on file    Gets together: Not on file    Attends religious service: Not on file    Active member of club or organization: Not on file    Attends meetings of clubs or organizations: Not on file    Relationship status: Not on file  . Intimate partner violence    Fear of current or ex partner: Not on file    Emotionally abused: Not on file    Physically abused: Not on file    Forced sexual activity: Not on file  Other Topics Concern  . Not on file  Social History Narrative   Born and raised in [place] by parents. Pt has one older sister. Mom suffered from depression and it affected how she raised the pt. Pt has a college degree in Associate Emergency Medicine. Pt moved to [place] in 2009 due to her husband's job. Pt has bee  married 14 yrs and has 2 kids. 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     Past Surgical History:  Procedure Laterality Date  . NO PAST SURGERIES      Family History  Problem Relation Age of Onset  . Depression Mother   . Stroke Mother   . Hypertension Mother   . Depression Sister   . Heart murmur Sister   . Hypertension Father     Allergies  Allergen Reactions  . Sulfa Antibiotics Other (See Comments), Palpitations and Rash    Syncope Syncope Rapid heartrate Syncope Syncope  . Codeine Rash  . Sudafed [Pseudoephedrine] Rash    Current Outpatient Medications on File Prior to Visit  Medication Sig Dispense Refill  . albuterol (PROVENTIL HFA;VENTOLIN HFA) 108 (90 Base) MCG/ACT inhaler Inhale 2 puffs into the lungs every 6  (six) hours as needed for wheezing or shortness of breath. Can give proair. 1 Inhaler 2  . ALPRAZolam (XANAX) 0.25 MG tablet 1 tab daily if needed 30 tablet 0  . atenolol (TENORMIN) 25 MG tablet Take 1 tablet daily as needed for palpitations 30 tablet 2  . ezetimibe (ZETIA) 10 MG tablet Take 1 tablet (10 mg total) by mouth daily. 30 tablet 3   No current facility-administered medications on file prior to visit.     BP 139/87 (BP Location: Right Arm, Patient Position: Sitting, Cuff Size: Small)   Pulse 90   Temp 98.6 F (37 C) (Temporal)   Resp 16   Wt 129 lb (58.5 kg)   SpO2 99%   BMI 22.14 kg/m       Objective:   Physical Exam  General- No acute distress. Pleasant patient. Neck- Full range of motion, no jvd Lungs- Clear, even and unlabored. Heart- regular rate and rhythm. Neurologic- CNII- XII grossly intact.       Assessment & Plan:  Pt has hx of covid and over infection now. Negative test after infection. She wants IGG antibody study now.  For bp, continue tenormin.  For high cholesterol, continue zetia.  Future labs cmp and lipid panel to be done next week.  No wheezing recently but use albuterol if needed. If using frequently please update me.  Follow up date to be determined after lab review.    Mackie Pai, PA-C

## 2019-01-09 NOTE — Patient Instructions (Addendum)
Pt has hx of covid and over infection now. Negative test after infection. She wants IGG antibody study now.  For bp, continue tenormin.  For high cholesterol, continue zetia.  Future labs cmp and lipid panel to be done next week.  No wheezing recently but use albuterol if needed. If using frequently please update me.  Follow up date to be determined after lab review.

## 2019-01-10 LAB — SAR COV2 SEROLOGY (COVID19)AB(IGG),IA: SARS CoV2 AB IGG: POSITIVE — AB

## 2019-01-10 LAB — CBC WITH DIFFERENTIAL/PLATELET
Basophils Absolute: 0.1 10*3/uL (ref 0.0–0.1)
Basophils Relative: 1.3 % (ref 0.0–3.0)
Eosinophils Absolute: 0.1 10*3/uL (ref 0.0–0.7)
Eosinophils Relative: 1.5 % (ref 0.0–5.0)
HCT: 39.6 % (ref 36.0–46.0)
Hemoglobin: 13.7 g/dL (ref 12.0–15.0)
Lymphocytes Relative: 39.5 % (ref 12.0–46.0)
Lymphs Abs: 2.9 10*3/uL (ref 0.7–4.0)
MCHC: 34.6 g/dL (ref 30.0–36.0)
MCV: 90.2 fl (ref 78.0–100.0)
Monocytes Absolute: 0.8 10*3/uL (ref 0.1–1.0)
Monocytes Relative: 10.3 % (ref 3.0–12.0)
Neutro Abs: 3.5 10*3/uL (ref 1.4–7.7)
Neutrophils Relative %: 47.4 % (ref 43.0–77.0)
Platelets: 242 10*3/uL (ref 150.0–400.0)
RBC: 4.39 Mil/uL (ref 3.87–5.11)
RDW: 13.2 % (ref 11.5–15.5)
WBC: 7.3 10*3/uL (ref 4.0–10.5)

## 2019-01-11 NOTE — Progress Notes (Signed)
Results given to patient

## 2019-02-08 ENCOUNTER — Emergency Department (HOSPITAL_BASED_OUTPATIENT_CLINIC_OR_DEPARTMENT_OTHER)
Admission: EM | Admit: 2019-02-08 | Discharge: 2019-02-08 | Disposition: A | Discharge status: Home / Self Care | Payer: No Typology Code available for payment source | Attending: Emergency Medicine | Admitting: Emergency Medicine

## 2019-02-08 ENCOUNTER — Emergency Department (HOSPITAL_BASED_OUTPATIENT_CLINIC_OR_DEPARTMENT_OTHER): Payer: No Typology Code available for payment source

## 2019-02-08 ENCOUNTER — Other Ambulatory Visit: Payer: Self-pay

## 2019-02-08 ENCOUNTER — Encounter (HOSPITAL_BASED_OUTPATIENT_CLINIC_OR_DEPARTMENT_OTHER): Payer: Self-pay | Admitting: *Deleted

## 2019-02-08 DIAGNOSIS — I10 Essential (primary) hypertension: Secondary | ICD-10-CM | POA: Insufficient documentation

## 2019-02-08 DIAGNOSIS — N3000 Acute cystitis without hematuria: Secondary | ICD-10-CM | POA: Diagnosis not present

## 2019-02-08 DIAGNOSIS — R55 Syncope and collapse: Secondary | ICD-10-CM | POA: Diagnosis not present

## 2019-02-08 DIAGNOSIS — Z79899 Other long term (current) drug therapy: Secondary | ICD-10-CM | POA: Insufficient documentation

## 2019-02-08 DIAGNOSIS — J45909 Unspecified asthma, uncomplicated: Secondary | ICD-10-CM | POA: Insufficient documentation

## 2019-02-08 DIAGNOSIS — R002 Palpitations: Secondary | ICD-10-CM | POA: Diagnosis present

## 2019-02-08 LAB — URINALYSIS, MICROSCOPIC (REFLEX)

## 2019-02-08 LAB — COMPREHENSIVE METABOLIC PANEL
ALT: 12 U/L (ref 0–44)
AST: 14 U/L — ABNORMAL LOW (ref 15–41)
Albumin: 4 g/dL (ref 3.5–5.0)
Alkaline Phosphatase: 72 U/L (ref 38–126)
Anion gap: 8 (ref 5–15)
BUN: 14 mg/dL (ref 6–20)
CO2: 29 mmol/L (ref 22–32)
Calcium: 9.2 mg/dL (ref 8.9–10.3)
Chloride: 102 mmol/L (ref 98–111)
Creatinine, Ser: 0.72 mg/dL (ref 0.44–1.00)
GFR calc Af Amer: >60 mL/min (ref >60)
GFR calc non Af Amer: >60 mL/min (ref >60)
Glucose, Bld: 137 mg/dL — ABNORMAL HIGH (ref 70–99)
Potassium: 3.2 mmol/L — ABNORMAL LOW (ref 3.5–5.1)
Sodium: 139 mmol/L (ref 135–145)
Total Bilirubin: 0.7 mg/dL (ref 0.3–1.2)
Total Protein: 7.1 g/dL (ref 6.5–8.1)

## 2019-02-08 LAB — URINALYSIS, ROUTINE W REFLEX MICROSCOPIC
Bilirubin Urine: NEGATIVE
Glucose, UA: NEGATIVE mg/dL
Ketones, ur: NEGATIVE mg/dL
Nitrite: NEGATIVE
Protein, ur: NEGATIVE mg/dL
Specific Gravity, Urine: 1.01 (ref 1.005–1.030)
pH: 6 (ref 5.0–8.0)

## 2019-02-08 LAB — CBC WITH DIFFERENTIAL/PLATELET
Abs Immature Granulocytes: 0.02 10*3/uL (ref 0.00–0.07)
Basophils Absolute: 0 10*3/uL (ref 0.0–0.1)
Basophils Relative: 0 %
Eosinophils Absolute: 0.1 10*3/uL (ref 0.0–0.5)
Eosinophils Relative: 1 %
HCT: 39.1 % (ref 36.0–46.0)
Hemoglobin: 13.4 g/dL (ref 12.0–15.0)
Immature Granulocytes: 0 %
Lymphocytes Relative: 40 %
Lymphs Abs: 2.5 10*3/uL (ref 0.7–4.0)
MCH: 31.2 pg (ref 26.0–34.0)
MCHC: 34.3 g/dL (ref 30.0–36.0)
MCV: 90.9 fL (ref 80.0–100.0)
Monocytes Absolute: 0.5 10*3/uL (ref 0.1–1.0)
Monocytes Relative: 8 %
Neutro Abs: 3.2 10*3/uL (ref 1.7–7.7)
Neutrophils Relative %: 51 %
Platelets: 278 10*3/uL (ref 150–400)
RBC: 4.3 MIL/uL (ref 3.87–5.11)
RDW: 13 % (ref 11.5–15.5)
WBC: 6.3 10*3/uL (ref 4.0–10.5)
nRBC: 0 % (ref 0.0–0.2)

## 2019-02-08 LAB — D-DIMER, QUANTITATIVE: D-Dimer, Quant: <0.27 ug/mL-FEU (ref 0.00–0.50)

## 2019-02-08 LAB — PREGNANCY, URINE: Preg Test, Ur: NEGATIVE

## 2019-02-08 MED ORDER — SODIUM CHLORIDE 0.9 % IV BOLUS
1000.0000 mL | Freq: Once | INTRAVENOUS | Status: AC
  Administered 2019-02-08: 1000 mL via INTRAVENOUS

## 2019-02-08 MED ORDER — CEPHALEXIN 500 MG PO CAPS: 500.0000 mg | ORAL_CAPSULE | Freq: Four times a day (QID) | ORAL | 0 refills | Status: DC

## 2019-02-08 NOTE — Discharge Instructions (Addendum)
Return here as needed.  Increase your fluid intake.  Follow-up with your primary doctor.

## 2019-02-08 NOTE — ED Provider Notes (Signed)
[facility] EMERGENCY DEPARTMENT Provider Note   CSN: [id number] Arrival date & time: 02/08/19  1358     History Chief Complaint  Patient presents with  . Palpitations  . Hypertension    Tienna Alami is a 44 y.o. female.  HPI Patient presents to the emergency department with heart racing and elevated blood pressure that started earlier today.  The patient states that she has had a history of panic attacks in the past but this felt more severe.  The patient states that she did not take any medications other than aspirin prior to arrival.  Patient states that nothing seems make her condition better or worse.  Patient states that she did not have any pain associated with this but shortness of breath and rapid breathing.  The patient denies chest pain, headache,blurred vision, neck pain, fever, cough, weakness, numbness, dizziness, anorexia, edema, abdominal pain, nausea, vomiting, diarrhea, rash, back pain, dysuria, hematemesis, bloody stool, near syncope, or syncope.    Past Medical History:  Diagnosis Date  . Anxiety   . Asthma   . Fibromyalgia   . Headache   . Palpitations   . TMJ (dislocation of temporomandibular joint)     Patient Active Problem List   Diagnosis Date Noted  . GAD (generalized anxiety disorder) 04/07/2015  . Panic disorder without agoraphobia 04/07/2015  . Anxiety state 02/27/2015  . Palpitations 02/27/2015  . Fibromyalgia 02/27/2015  . 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 NO PAST SURGERIES       OB History   No obstetric history on file.     Family History  Problem Relation Age of Onset  . Depression Mother   . Stroke Mother   . Hypertension Mother   . Depression Sister   . Heart murmur Sister   . Hypertension Father     Social History   Tobacco Use  . Smoking status: Never Smoker  . Smokeless tobacco: Never Used  Substance Use Topics  . Alcohol use: No  . Drug use: No     Home Medications Prior to Admission medications   Medication Sig Start Date End Date Taking? Authorizing Provider  albuterol (PROVENTIL HFA;VENTOLIN HFA) 108 (90 Base) MCG/ACT inhaler Inhale 2 puffs into the lungs every 6 (six) hours as needed for wheezing or shortness of breath. Can give proair. 03/08/17   Saguier, Percell Miller, PA-C  ALPRAZolam Duanne Moron) 0.25 MG tablet 1 tab daily if needed 01/09/19   Saguier, Percell Miller, PA-C  atenolol (TENORMIN) 25 MG tablet Take 1 tablet daily as needed for palpitations 03/02/18   Saguier, Percell Miller, PA-C  ezetimibe (ZETIA) 10 MG tablet Take 1 tablet (10 mg total) by mouth daily. 07/16/18   Saguier, Percell Miller, PA-C    Allergies    Sulfa antibiotics, Codeine, and Sudafed [pseudoephedrine]  Review of Systems   Review of Systems All other systems negative except as documented in the HPI. All pertinent positives and negatives as reviewed in the HPI. Physical Exam Updated Vital Signs BP (!) 151/106   Pulse (!) 123   Temp 98.2 F (36.8 C) (Oral)   Resp 18   Ht 5\' 4"  (1.626 m)   Wt 58.1 kg   SpO2 100%   BMI 21.97 kg/m   Physical Exam Vitals and nursing note reviewed.  Constitutional:      General: She is not in acute distress.    Appearance: She is well-developed.  HENT:     Head: Normocephalic and  atraumatic.  Eyes:     Pupils: Pupils are equal, round, and reactive to light.  Cardiovascular:     Rate and Rhythm: Regular rhythm. Tachycardia present.     Heart sounds: Normal heart sounds. No murmur. No friction rub. No gallop.   Pulmonary:     Effort: Pulmonary effort is normal. No respiratory distress.     Breath sounds: Normal breath sounds. No stridor. No wheezing, rhonchi or rales.  Abdominal:     General: Bowel sounds are normal. There is no distension.     Palpations: Abdomen is soft.     Tenderness: There is no abdominal tenderness.  Musculoskeletal:     Cervical back: Normal range of motion and neck supple.  Skin:    General: Skin is warm and  dry.     Capillary Refill: Capillary refill takes less than 2 seconds.     Findings: No erythema or rash.  Neurological:     Mental Status: She is alert and oriented to person, place, and time.     Motor: No abnormal muscle tone.     Coordination: Coordination normal.  Psychiatric:        Behavior: Behavior normal.     ED Results / Procedures / Treatments   Labs (all labs ordered are listed, but only abnormal results are displayed) Labs Reviewed  COMPREHENSIVE METABOLIC PANEL  CBC WITH DIFFERENTIAL/PLATELET  URINALYSIS, ROUTINE W REFLEX MICROSCOPIC  PREGNANCY, URINE  D-DIMER, QUANTITATIVE (NOT AT [facility])    EKG EKG Interpretation  Date/Time:  Friday February 08 2019 14:06:40 EST Ventricular Rate:  128 PR Interval:  130 QRS Duration: 90 QT Interval:  326 QTC Calculation: 475 R Axis:   81 Text Interpretation: Sinus tachycardia Otherwise normal ECG Confirmed by Quintella Reichert [phone]) on 02/08/2019 2:12:22 PM   Radiology No results found.  Procedures Procedures (including critical care time)  Medications Ordered in ED Medications  sodium chloride 0.9 % bolus 1,000 mL (has no administration in time range)    ED Course  I have reviewed the triage vital signs and the nursing notes.  Pertinent labs & imaging results that were available during my care of the patient were reviewed by me and considered in my medical decision making (see chart for details).    MDM Rules/Calculators/A&P                     Patient will be assessed with laboratory testing and chest x-ray.  The patient does not have any acute abnormalities other than tachycardia noted on her EKG.  Patient is advised of the current plan and I advised her that we will update her once her laboratory testing and diagnostics have returned.  She is feeling better following IV fluids and told to return here as needed.  I did advise her to follow-up with her primary doctor for recheck.  Patient's vital signs have  normalized since she has been here in the emergency department.  There could be a component of anxiety this as well. Final Clinical Impression(s) / ED Diagnoses Final diagnoses:  None    Rx / DC Orders ED Discharge Orders    None       Dalia Heading, PA-C 02/12/19 1534    Quintella Reichert, MD 02/13/19 [phone]

## 2019-02-08 NOTE — ED Triage Notes (Signed)
States 10 minutes ago at home she felt like she was having a rapid heart beat. She took her BP at home and it was elevated. States hx of anxiety in the past that has made her have the same symptoms. Covid positive 2 months.

## 2019-02-19 ENCOUNTER — Other Ambulatory Visit: Payer: Self-pay

## 2019-02-19 ENCOUNTER — Other Ambulatory Visit (INDEPENDENT_AMBULATORY_CARE_PROVIDER_SITE_OTHER): Payer: No Typology Code available for payment source

## 2019-02-19 DIAGNOSIS — E785 Hyperlipidemia, unspecified: Secondary | ICD-10-CM | POA: Diagnosis not present

## 2019-02-19 DIAGNOSIS — I10 Essential (primary) hypertension: Secondary | ICD-10-CM | POA: Diagnosis not present

## 2019-02-19 LAB — COMPREHENSIVE METABOLIC PANEL
ALT: 8 U/L (ref 0–35)
AST: 14 U/L (ref 0–37)
Albumin: 4.3 g/dL (ref 3.5–5.2)
Alkaline Phosphatase: 80 U/L (ref 39–117)
BUN: 11 mg/dL (ref 6–23)
CO2: 29 mEq/L (ref 19–32)
Calcium: 9.5 mg/dL (ref 8.4–10.5)
Chloride: 101 mEq/L (ref 96–112)
Creatinine, Ser: 0.77 mg/dL (ref 0.40–1.20)
GFR: 82.36 mL/min (ref >60.00)
Glucose, Bld: 92 mg/dL (ref 70–99)
Potassium: 4.3 mEq/L (ref 3.5–5.1)
Sodium: 139 mEq/L (ref 135–145)
Total Bilirubin: 0.6 mg/dL (ref 0.2–1.2)
Total Protein: 7 g/dL (ref 6.0–8.3)

## 2019-02-19 LAB — LIPID PANEL
Cholesterol: 201 mg/dL — ABNORMAL HIGH (ref 0–200)
HDL: 53 mg/dL (ref >39.00)
LDL Cholesterol: 125 mg/dL — ABNORMAL HIGH (ref 0–99)
NonHDL: 148.17
Total CHOL/HDL Ratio: 4
Triglycerides: 114 mg/dL (ref 0.0–149.0)
VLDL: 22.8 mg/dL (ref 0.0–40.0)

## 2019-04-09 ENCOUNTER — Ambulatory Visit (INDEPENDENT_AMBULATORY_CARE_PROVIDER_SITE_OTHER): Payer: No Typology Code available for payment source | Admitting: Medical

## 2019-04-09 ENCOUNTER — Other Ambulatory Visit: Payer: Self-pay

## 2019-04-09 ENCOUNTER — Ambulatory Visit (HOSPITAL_BASED_OUTPATIENT_CLINIC_OR_DEPARTMENT_OTHER)
Admission: RE | Admit: 2019-04-09 | Discharge: 2019-04-09 | Disposition: A | Discharge status: Home / Self Care | Payer: No Typology Code available for payment source | Source: Ambulatory Visit | Attending: Medical | Admitting: Medical

## 2019-04-09 VITALS — BP 122/82 | HR 77 | Temp 98.0°F | Resp 16 | Ht 64.0 in | Wt 131.8 lb

## 2019-04-09 DIAGNOSIS — N926 Irregular menstruation, unspecified: Secondary | ICD-10-CM

## 2019-04-09 DIAGNOSIS — M533 Sacrococcygeal disorders, not elsewhere classified: Secondary | ICD-10-CM

## 2019-04-09 DIAGNOSIS — M545 Low back pain, unspecified: Secondary | ICD-10-CM

## 2019-04-09 LAB — FOLLICLE STIMULATING HORMONE: FSH: 59.3 m[IU]/mL

## 2019-04-09 LAB — POCT URINE PREGNANCY: Preg Test, Ur: NEGATIVE

## 2019-04-09 MED ORDER — TRAMADOL HCL 50 MG PO TABS: 50.0000 mg | ORAL_TABLET | Freq: Four times a day (QID) | ORAL | 0 refills | Status: AC | PRN

## 2019-04-09 MED ORDER — KETOROLAC TROMETHAMINE 60 MG/2ML IM SOLN
60.0000 mg | Freq: Once | INTRAMUSCULAR | Status: AC
  Administered 2019-04-09: 60 mg via INTRAMUSCULAR

## 2019-04-09 NOTE — Progress Notes (Signed)
Subjective:    Patient ID: Christine Rosales, female    DOB: April 16, 1975, 44 y.o.   MRN: [id number]  HPI  Pt in states she fell 2 days ago. Pt states walking in her house and slipped on piece of plastic on the floor. She describes landed on her lower back/coccyx area. Since then when walks feel like has click feeling to lower back/coccyx area. Pt also has diffuse upper back pain. States feel like pain made her fibromyalgia flare.   lmp- June 2020. Mom had menopause at 35 yo.    Review of Systems  Constitutional: Negative for chills, fatigue and fever.  Respiratory: Negative for cough, chest tightness, shortness of breath and wheezing.   Cardiovascular: Negative for chest pain and palpitations.  Gastrointestinal: Negative for abdominal pain.  Genitourinary: Negative for dysuria.  Musculoskeletal: Positive for back pain.       L-s spine  Skin: Negative for rash.  Neurological: Negative for dizziness and headaches.  Hematological: Negative for adenopathy. Does not bruise/bleed easily.  Psychiatric/Behavioral: Negative for behavioral problems and decreased concentration. The patient is not nervous/anxious.     Past Medical History:  Diagnosis Date  . Anxiety   . Asthma   . Fibromyalgia   . Headache   . Palpitations   . TMJ (dislocation of temporomandibular joint)      Social History   Socioeconomic History  . Marital status: Married    Spouse name: Not on file  . Number of children: Not on file  . Years of education: Not on file  . Highest education level: Not on file  Occupational History  . Not on file  Tobacco Use  . Smoking status: Never Smoker  . Smokeless tobacco: Never Used  Substance and Sexual Activity  . Alcohol use: No  . Drug use: No  . Sexual activity: Yes    Comment: husband has vascetomy  Other Topics Concern  . Not on file  Social History Narrative   Born and raised in [place] by parents. Pt has one older sister. Mom suffered from  depression and it affected how she raised the pt. Pt has a college degree in Associate Emergency Medicine. Pt moved to [place] in 2009 due to her husband's job. Pt has bee married 14 yrs and has 2 kids. 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    Social Determinants of Health   Financial Resource Strain:   . Difficulty of Paying Living Expenses: Not on file  Food Insecurity:   . Worried About Charity fundraiser in the Last Year: Not on file  . Ran Out of Food in the Last Year: Not on file  Transportation Needs:   . Lack of Transportation (Medical): Not on file  . Lack of Transportation (Non-Medical): Not on file  Physical Activity:   . Days of Exercise per Week: Not on file  . Minutes of Exercise per Session: Not on file  Stress:   . Feeling of Stress : Not on file  Social Connections:   . Frequency of Communication with Friends and Family: Not on file  . Frequency of Social Gatherings with Friends and Family: Not on file  . Attends Religious Services: Not on file  . Active Member of Clubs or Organizations: Not on file  . Attends Archivist Meetings: Not on file  . Marital Status: Not on file  Intimate Partner Violence:   . Fear of Current or Ex-Partner:  Not on file  . Emotionally Abused: Not on file  . Physically Abused: Not on file  . Sexually Abused: Not on file    Past Surgical History:  Procedure Laterality Date  . NO PAST SURGERIES      Family History  Problem Relation Age of Onset  . Depression Mother   . Stroke Mother   . Hypertension Mother   . Depression Sister   . Heart murmur Sister   . Hypertension Father     Allergies  Allergen Reactions  . Sulfa Antibiotics Other (See Comments), Palpitations and Rash    Syncope Syncope Rapid heartrate Syncope Syncope  . Codeine Rash  . Sudafed [Pseudoephedrine] Rash    Current Outpatient Medications on File Prior to Visit  Medication Sig Dispense Refill  . albuterol  (PROVENTIL HFA;VENTOLIN HFA) 108 (90 Base) MCG/ACT inhaler Inhale 2 puffs into the lungs every 6 (six) hours as needed for wheezing or shortness of breath. Can give proair. 1 Inhaler 2  . ALPRAZolam (XANAX) 0.25 MG tablet 1 tab daily if needed 30 tablet 0  . atenolol (TENORMIN) 25 MG tablet Take 1 tablet daily as needed for palpitations 30 tablet 2  . cephALEXin (KEFLEX) 500 MG capsule Take 1 capsule (500 mg total) by mouth 4 (four) times daily. 28 capsule 0  . ezetimibe (ZETIA) 10 MG tablet Take 1 tablet (10 mg total) by mouth daily. 30 tablet 3   No current facility-administered medications on file prior to visit.    BP 122/82 (BP Location: Left Arm, Patient Position: Sitting, Cuff Size: Small)   Pulse 77   Temp 98 F (36.7 C) (Temporal)   Resp 16   Ht 5\' 4"  (1.626 m)   Wt 131 lb 12.8 oz (59.8 kg)   SpO2 99%   BMI 22.62 kg/m       Objective:   Physical Exam  General Appearance- Not in acute distress.  Neck- no mid cspine tenderness or trapezius pain presently.  Chest and Lung Exam Auscultation: Breath sounds:-Normal. Clear even and unlabored. Adventitious sounds:- No Adventitious sounds.  Cardiovascular Auscultation:Rythm - Regular, rate and rythm. Heart Sounds -Normal heart sounds.  Abdomen Inspection:-Inspection Normal.  Palpation/Perucssion: Palpation and Percussion of the abdomen reveal- Non Tender, No Rebound tenderness, No rigidity(Guarding) and No Palpable abdominal masses.  Liver:-Normal.  Spleen:- Normal.   Back  No mid tspine or posterior rib area pan. Mid lower lumbar spine tenderness to palpation. Si area tenderness. Also mild coccyx area pain No pain on straight leg lift. Pain on lateral movements and flexion/extension of the spine.  Lower ext neurologic  L5-S1 sensation intact bilaterally. Normal patellar reflexes bilaterally. No foot drop bilaterally.      Assessment & Plan:  For your lower back /coccyx region pain post fall, will get lumbar  and coccyx x-ray today.  Recommend using diclofenac nsaid twice daily and making tramadol available for moderate to severe pain.  Rx advisement given.  We will follow x-rays and notify you of those results.  Recommend back stretching exercises as tolerated.  In the office today we gave you Toradol 60 mg IM injection.  I recommend starting diclofenac tomorrow morning.  For late menses will get urine pregnancy test which was negative.  Also will go ahead and get [facility].  Follow-up in 2 weeks or as needed.   25 minutes spent with pt today.

## 2019-04-09 NOTE — Addendum Note (Signed)
Addended by: Anabel Halon on: 04/09/2019 11:23 AM   Modules accepted: Orders

## 2019-04-09 NOTE — Patient Instructions (Addendum)
For your lower back /coccyx region pain post fall, will get lumbar and coccyx x-ray today.  Recommend using diclofenac nsaid twice daily and making tramadol available for moderate to severe pain.  Rx advisement given.  We will follow x-rays and notify you of those results.  Recommend back stretching exercises as tolerated.  In the office today we gave you Toradol 60 mg IM injection.  I recommend starting diclofenac tomorrow morning.  For late menses will get urine pregnancy test which was negative.  Also will go ahead and get [facility].  Follow-up in 2 weeks or as needed.    Ejercicios para la espalda Back Exercises Estos ejercicios ayudan a fortalecer el tronco y la espalda. adems, ayudan a mantener la flexibilidad de la zona lumbar. Hacer estos ejercicios puede ser de ayuda para evitar o Best boy de espalda.  Si tiene dolor de espalda, trate de Dispensing optician ejercicios 2 o 3veces por da, o como se lo haya indicado el mdico.  A medida que [place], haga los ejercicios una vez por da. Repita los ejercicios con ms frecuencia como se lo haya indicado el mdico.  Para evitar que el dolor regrese, haga los ejercicios una vez por da o como se lo haya indicado el mdico. Ejercicios Rodilla al pecho Repita estos pasos 3 o 5veces seguidas con cada pierna: 1. Acustese boca arriba sobre una cama dura o sobre el suelo con las piernas extendidas. 2. Lleve una rodilla al pecho. 3. Agarre la rodilla o el muslo con ambas manos y sostngalo en su lugar. 4. Tire de la rodilla hasta sentir una elongacin suave en la parte baja de la espalda o las nalgas. 5. Mantenga la elongacin durante 10 a 30segundos. 6. Suelte y extienda la pierna lentamente. Inclinacin de la pelvis Repita estos pasos 5 o 10veces seguidas: 1. Acustese boca arriba sobre una cama dura o sobre el suelo con las piernas extendidas. 2. Flexione las rodillas de manera que apunten al techo. Los pies deben estar apoyados en el  suelo. 3. Contraiga los msculos de la parte baja del vientre (abdomen) para empujar la zona lumbar contra el suelo. Este movimiento har que el coxis apunte hacia el techo, en lugar de apuntar hacia abajo en direccin a los pies o al suelo. 4. Mantenga esta posicin durante 5 a 10segundos mientras contrae suavemente los msculos y respira con normalidad. El perro y el gato Repita estos pasos hasta que la zona lumbar se curve con ms facilidad: 1. [place] manos y las rodillas sobre una superficie firme. Las manos deben estar alineadas con los hombros y las rodillas con las caderas. Puede colocarse almohadillas debajo de las rodillas. 2. Deje que la cabeza cuelgue hacia el pecho. Tense (contraiga) los msculos del vientre. Baje el coxis en direccin al suelo de modo que la zona lumbar se arquee como el lomo de un [place]. 3. Mantenga esta posicin durante 5segundos. 4. [place] lentamente la cabeza. Relaje los msculos del vientre. Eleve el coxis de modo que apunte en direccin al techo para que la espalda forme un arco hundido como el lomo de un perro contento. 5. Mantenga esta posicin durante 5segundos.  Flexiones de brazos Repita estos pasos 5 o 10veces seguidas: 1. Acustese boca abajo en el suelo. [place] manos cerca de la cabeza, separadas aproximadamente al ancho de los hombros. 3. Con la espalda relajada y las caderas apoyadas en el suelo, extienda lentamente los brazos para levantar la mitad  superior del cuerpo y [employer] hombros. No use los msculos de la espalda. Puede cambiar la ubicacin de las manos para estar ms cmodo. 4. Mantenga esta posicin durante 5segundos. 5. Lentamente vuelva a la posicin horizontal.  Puentes Repita estos pasos 10veces seguidas: 1. Acustese boca arriba sobre una superficie firme. 2. Flexione las rodillas de manera que apunten al techo. Los pies deben estar apoyados en el suelo. Los brazos deben estar paralelos a los  costados del cuerpo, cerca del cuerpo. 3. Contraiga los glteos y despegue las nalgas del suelo hasta que la cintura est casi a la altura de las rodillas. Si no siente el trabajo muscular en las nalgas y la parte posterior de los muslos, aleje los pies 1 o 2pulgadas (2.5 o 5centmetros) de las nalgas. 4. Mantenga esta posicin durante 3 a 5segundos. 5. Lentamente, vuelva a apoyar las nalgas en el suelo y relaje los glteos. Si este ejercicio le resulta muy fcil, intente realizarlo con los brazos cruzados Dante. Abdominales Repita estos pasos 5 o 10veces seguidas: 1. Acustese boca arriba sobre una cama dura o sobre el suelo con las piernas extendidas. 2. Flexione las rodillas de manera que apunten al techo. Los pies deben estar apoyados en el suelo. 3. Cruce los [employer]. 4. Baje levemente el mentn en direccin al pecho, pero no doble el cuello. 5. Contraiga los msculos del abdomen y con lentitud eleve el pecho lo suficiente como para despegar levemente los omplatos del suelo. Evite levantar el cuerpo ms alto que eso, porque puede sobreexigir la zona lumbar. 6. Lentamente baje el pecho y la cabeza hasta el suelo. Elevaciones de espalda Repita estos pasos 5 o 10veces seguidas: 1. Acustese boca abajo con los brazos a los costados y apoye la frente en el suelo. 2. Contraiga los msculos de las piernas y los glteos. 3. Lentamente despegue el pecho del suelo mientras mantiene las caderas apoyadas en el suelo. Mantenga la nuca alineada con la curvatura de la espalda. Mire hacia el suelo mientras hace este ejercicio. 4. Mantenga esta posicin durante 3 a 5segundos. 5. Lentamente baje el pecho y el rostro hasta el suelo. Comunquese con un mdico si:  El dolor de espalda se vuelve mucho ms intenso cuando hace un ejercicio.  El dolor de espalda no mejora 2horas despus de [employer] ejercicios. Si tiene alguno de [employer], deje de [employer] ejercicios. No  vuelva a hacer los ejercicios a menos que el mdico lo autorice. Solicite ayuda inmediatamente si:  Siente un dolor sbito y muy intenso en la espalda. Si esto ocurre, deje de [employer]. No vuelva a hacer los ejercicios a menos que el mdico lo autorice. Esta informacin no tiene Marine scientist el consejo del mdico. Asegrese de hacerle al mdico cualquier pregunta que tenga. Document Revised: 11/16/2017 Document Reviewed: 11/16/2017 Elsevier Patient Education  [place].

## 2019-06-05 ENCOUNTER — Other Ambulatory Visit: Payer: Self-pay

## 2019-06-05 ENCOUNTER — Ambulatory Visit (INDEPENDENT_AMBULATORY_CARE_PROVIDER_SITE_OTHER): Payer: No Typology Code available for payment source | Admitting: Medical

## 2019-06-05 VITALS — BP 145/91 | HR 77 | Temp 97.7°F | Resp 20 | Ht 64.0 in | Wt 128.4 lb

## 2019-06-05 DIAGNOSIS — Z1152 Encounter for screening for COVID-19: Secondary | ICD-10-CM | POA: Diagnosis not present

## 2019-06-05 DIAGNOSIS — R002 Palpitations: Secondary | ICD-10-CM

## 2019-06-05 DIAGNOSIS — K219 Gastro-esophageal reflux disease without esophagitis: Secondary | ICD-10-CM | POA: Diagnosis not present

## 2019-06-05 MED ORDER — FAMOTIDINE 20 MG PO TABS: 20.0000 mg | ORAL_TABLET | Freq: Two times a day (BID) | ORAL | 0 refills | Status: DC

## 2019-06-05 MED FILL — FAMOTIDINE 20 MG TABS: 20 | 30 days supply | Qty: 60 | Fill #0

## 2019-06-05 NOTE — Progress Notes (Signed)
Subjective:    Patient ID: Christine Rosales, female    DOB: October 27, 1975, 44 y.o.   MRN: [id number]  HPI  Pt in for follow up.  Pt wants to have repeat igg covid antibody.She got covid in November. She does not want to get the vaccine for covid. No signs or symptoms presently.  Pt states not convinced vaccine work.   Pt states recently will have very brief transient palpitation after sheets. Almost every day. She is belching. She wonders if gerd. Happens every day for 2 weeks.     Review of Systems  Constitutional: Negative for chills, fatigue and fever.  Respiratory: Negative for cough, chest tightness, shortness of breath and wheezing.   Cardiovascular: Negative for chest pain and palpitations.  Gastrointestinal: Negative for abdominal pain, nausea and vomiting.  Musculoskeletal: Negative for back pain and myalgias.  Skin: Negative for rash.  Neurological: Negative for dizziness, seizures, weakness and headaches.  Hematological: Negative for adenopathy. Does not bruise/bleed easily.  Psychiatric/Behavioral: Negative for behavioral problems and confusion.   Past Medical History:  Diagnosis Date  . Anxiety   . Asthma   . Fibromyalgia   . Headache   . Palpitations   . TMJ (dislocation of temporomandibular joint)      Social History   Socioeconomic History  . Marital status: Married    Spouse name: Not on file  . Number of children: Not on file  . Years of education: Not on file  . Highest education level: Not on file  Occupational History  . Not on file  Tobacco Use  . Smoking status: Never Smoker  . Smokeless tobacco: Never Used  Substance and Sexual Activity  . Alcohol use: No  . Drug use: No  . Sexual activity: Yes    Comment: husband has vascetomy  Other Topics Concern  . Not on file  Social History Narrative   Born and raised in [place] by parents. Pt has one older sister. Mom suffered from depression and it affected how she raised the pt. Pt has  a college degree in Associate Emergency Medicine. Pt moved to [place] in 2009 due to her husband's job. Pt has bee married 14 yrs and has 2 kids. 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    Social Determinants of Health   Financial Resource Strain:   . Difficulty of Paying Living Expenses:   Food Insecurity:   . Worried About Charity fundraiser in the Last Year:   . Arboriculturist in the Last Year:   Transportation Needs:   . Film/video editor (Medical):   Marland Kitchen Lack of Transportation (Non-Medical):   Physical Activity:   . Days of Exercise per Week:   . Minutes of Exercise per Session:   Stress:   . Feeling of Stress :   Social Connections:   . Frequency of Communication with Friends and Family:   . Frequency of Social Gatherings with Friends and Family:   . Attends Religious Services:   . Active Member of Clubs or Organizations:   . Attends Archivist Meetings:   Marland Kitchen Marital Status:   Intimate Partner Violence:   . Fear of Current or Ex-Partner:   . Emotionally Abused:   Marland Kitchen Physically Abused:   . Sexually Abused:     Past Surgical History:  Procedure Laterality Date  . NO PAST SURGERIES      Family History  Problem Relation Age  of Onset  . Depression Mother   . Stroke Mother   . Hypertension Mother   . Depression Sister   . Heart murmur Sister   . Hypertension Father     Allergies  Allergen Reactions  . Sulfa Antibiotics Other (See Comments), Palpitations and Rash    Syncope Syncope Rapid heartrate Syncope Syncope  . Codeine Rash  . Sudafed [Pseudoephedrine] Rash    Current Outpatient Medications on File Prior to Visit  Medication Sig Dispense Refill  . albuterol (PROVENTIL HFA;VENTOLIN HFA) 108 (90 Base) MCG/ACT inhaler Inhale 2 puffs into the lungs every 6 (six) hours as needed for wheezing or shortness of breath. Can give proair. 1 Inhaler 2  . ALPRAZolam (XANAX) 0.25 MG tablet 1 tab daily if needed 30 tablet 0   . atenolol (TENORMIN) 25 MG tablet Take 1 tablet daily as needed for palpitations 30 tablet 2  . cephALEXin (KEFLEX) 500 MG capsule Take 1 capsule (500 mg total) by mouth 4 (four) times daily. (Patient not taking: Reported on 06/05/2019) 28 capsule 0  . ezetimibe (ZETIA) 10 MG tablet Take 1 tablet (10 mg total) by mouth daily. (Patient not taking: Reported on 06/05/2019) 30 tablet 3   No current facility-administered medications on file prior to visit.    BP (!) 145/91 (BP Location: Left Arm, Patient Position: Sitting, Cuff Size: Normal)   Pulse 77   Temp 97.7 F (36.5 C) (Temporal)   Resp 20   Ht 5\' 4"  (1.626 m)   Wt 128 lb 6.4 oz (58.2 kg)   SpO2 100%   BMI 22.04 kg/m       Objective:   Physical Exam  General- No acute distress. Pleasant patient. Neck- Full range of motion, no jvd Lungs- Clear, even and unlabored. Heart- regular rate and rhythm. Neurologic- CNII- XII grossly intact.       Assessment & Plan:  You have hx of covid and want to repeat igg to see if antibody still present. Even if + would recommend getting at least one vaccine before the fall. I have seen persons get covid despite prior infection and thought is booster needed.  Will follow igg result and notify you.  For atypical palpitation after eating will rx famotadine. Take daily and eat healthy. If still having palpitation after eating notify me and will refer back to cardiologist.  ekg showed nsr  Follow up 2 weeks or as needed  Mackie Pai, PA-C   Time spent with patient today was 25  minutes which consisted of chart review, discussing diagnosis, work up treatment and documentation.

## 2019-06-05 NOTE — Patient Instructions (Addendum)
You have hx of covid and want to repeat igg to see if antibody still present. Even if + would recommend getting at least one vaccine before the fall. I have seen persons get covid despite prior infection and thought is booster needed.  Will follow igg result and notify you.  For atypical palpitation after eating will rx famotadine. Take daily and eat healthy. If still having palpitation after eating notify me and will refer back to cardiologist.  ekg shows nsr(MA gave 2 to review. Both appeared same. Compared to prior and no change.)  Follow up 2 weeks or as needed

## 2019-06-06 LAB — SAR COV2 SEROLOGY (COVID19)AB(IGG),IA: DiaSorin SARS-CoV-2 Ab, IgG: POSITIVE

## 2019-06-07 IMAGING — DX DG THORACIC SPINE 2V
3 series · 3 of 3 positions shown · non-contrast
Comparison: Bone window images from CTA chest 10/25/2016. Chest
x-ray 07/07/2017.

CLINICAL DATA: 42-year-old who fell while on vacation approximately
2 months ago. Persistent LEFT LOWER rib pain and mid back pain.
Initial encounter.

EXAM:
THORACIC SPINE 2 VIEWS

[t-spine ap]
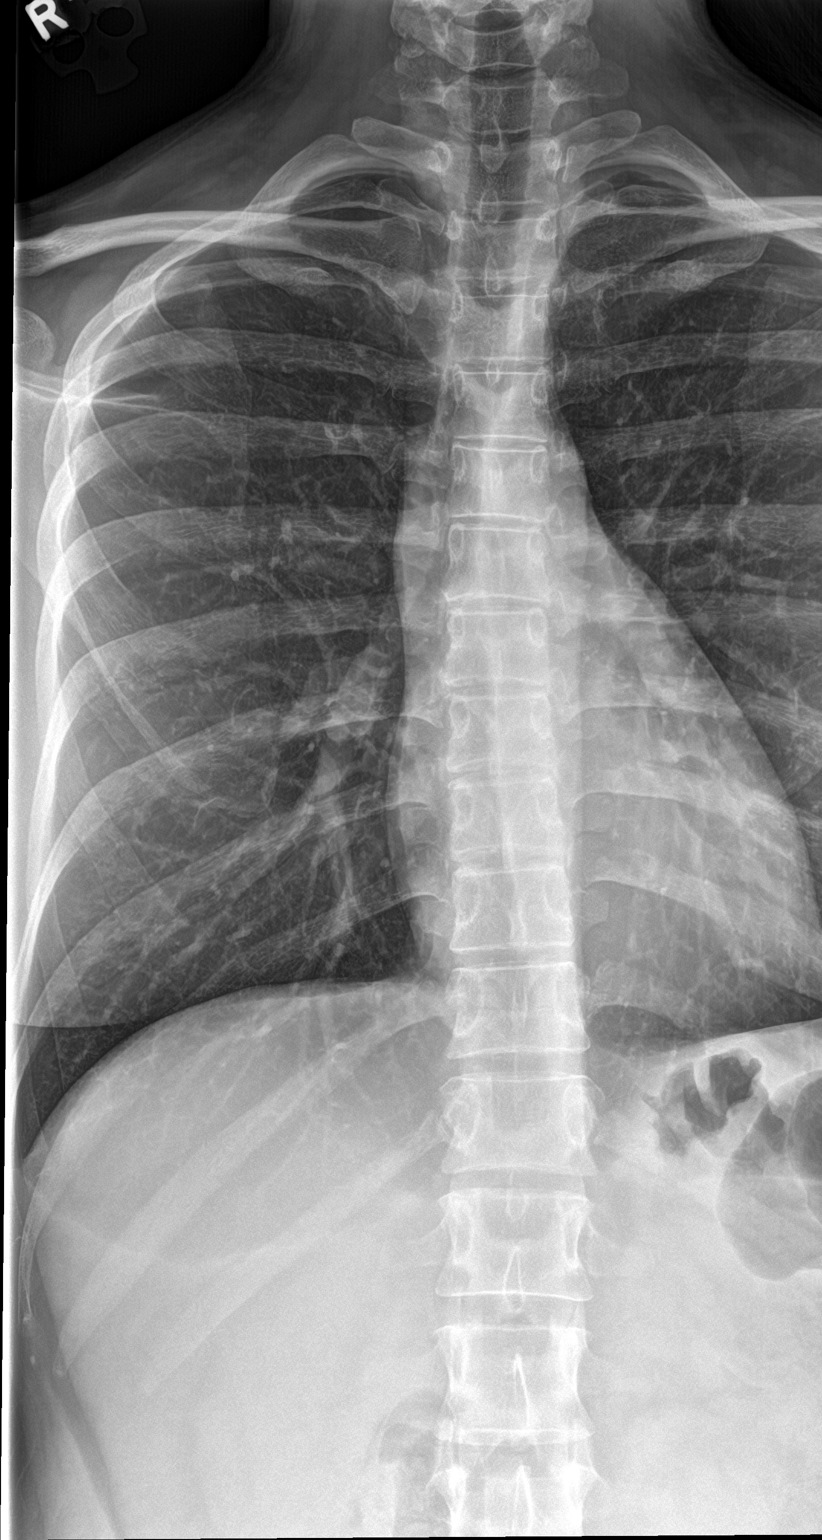

[t-spine lat]
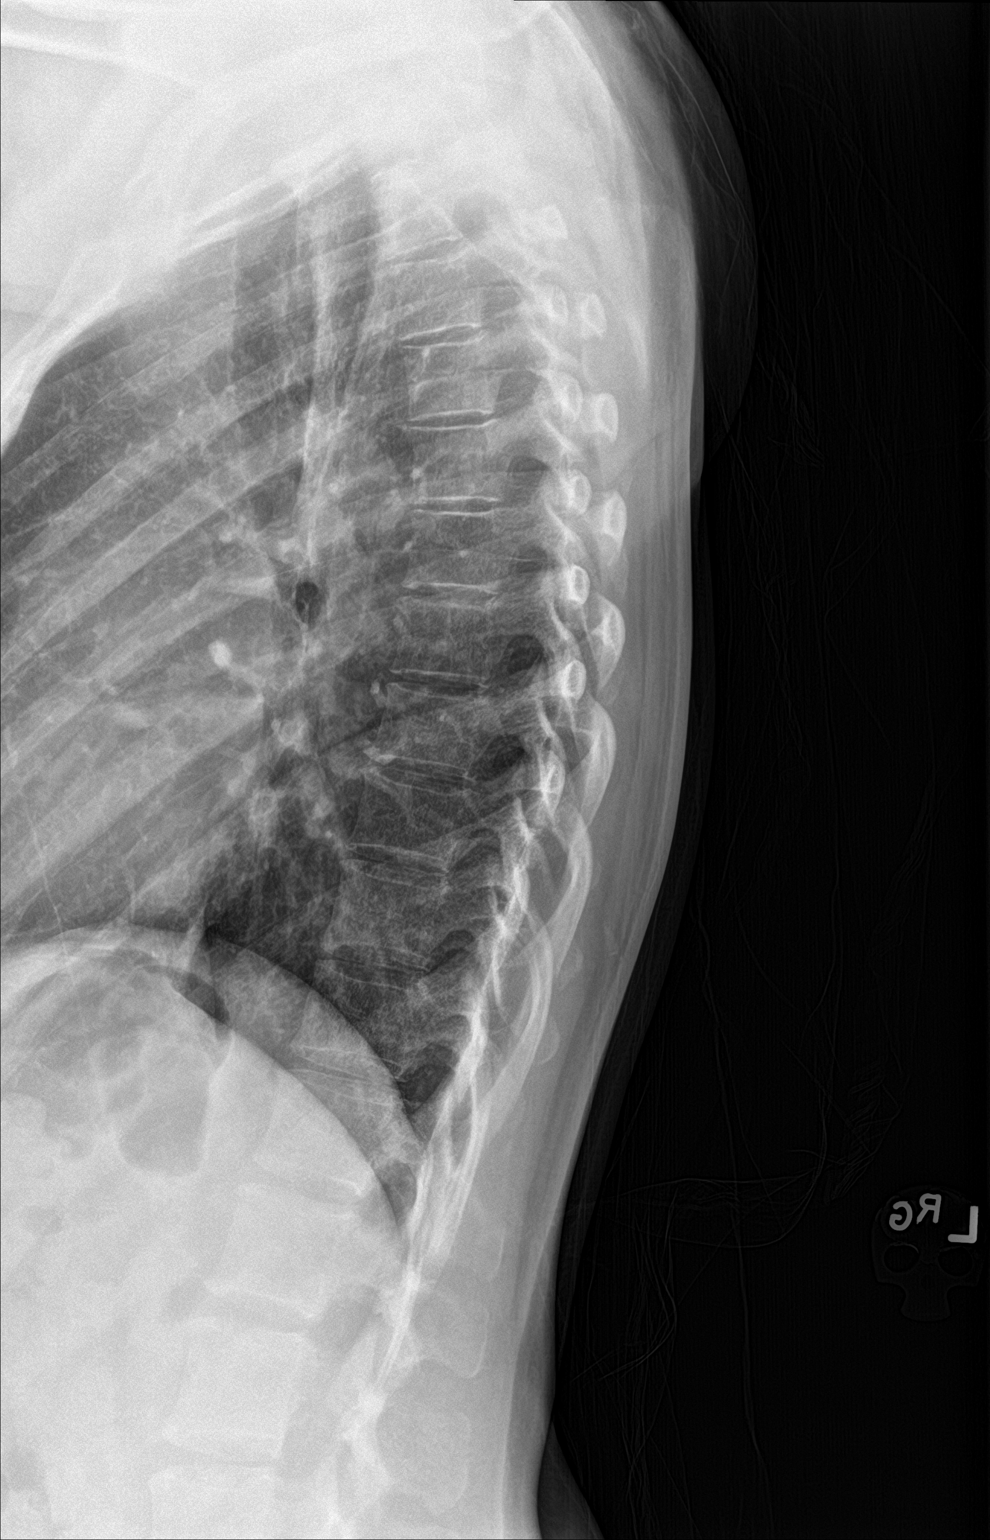

[t-spine swimmers]
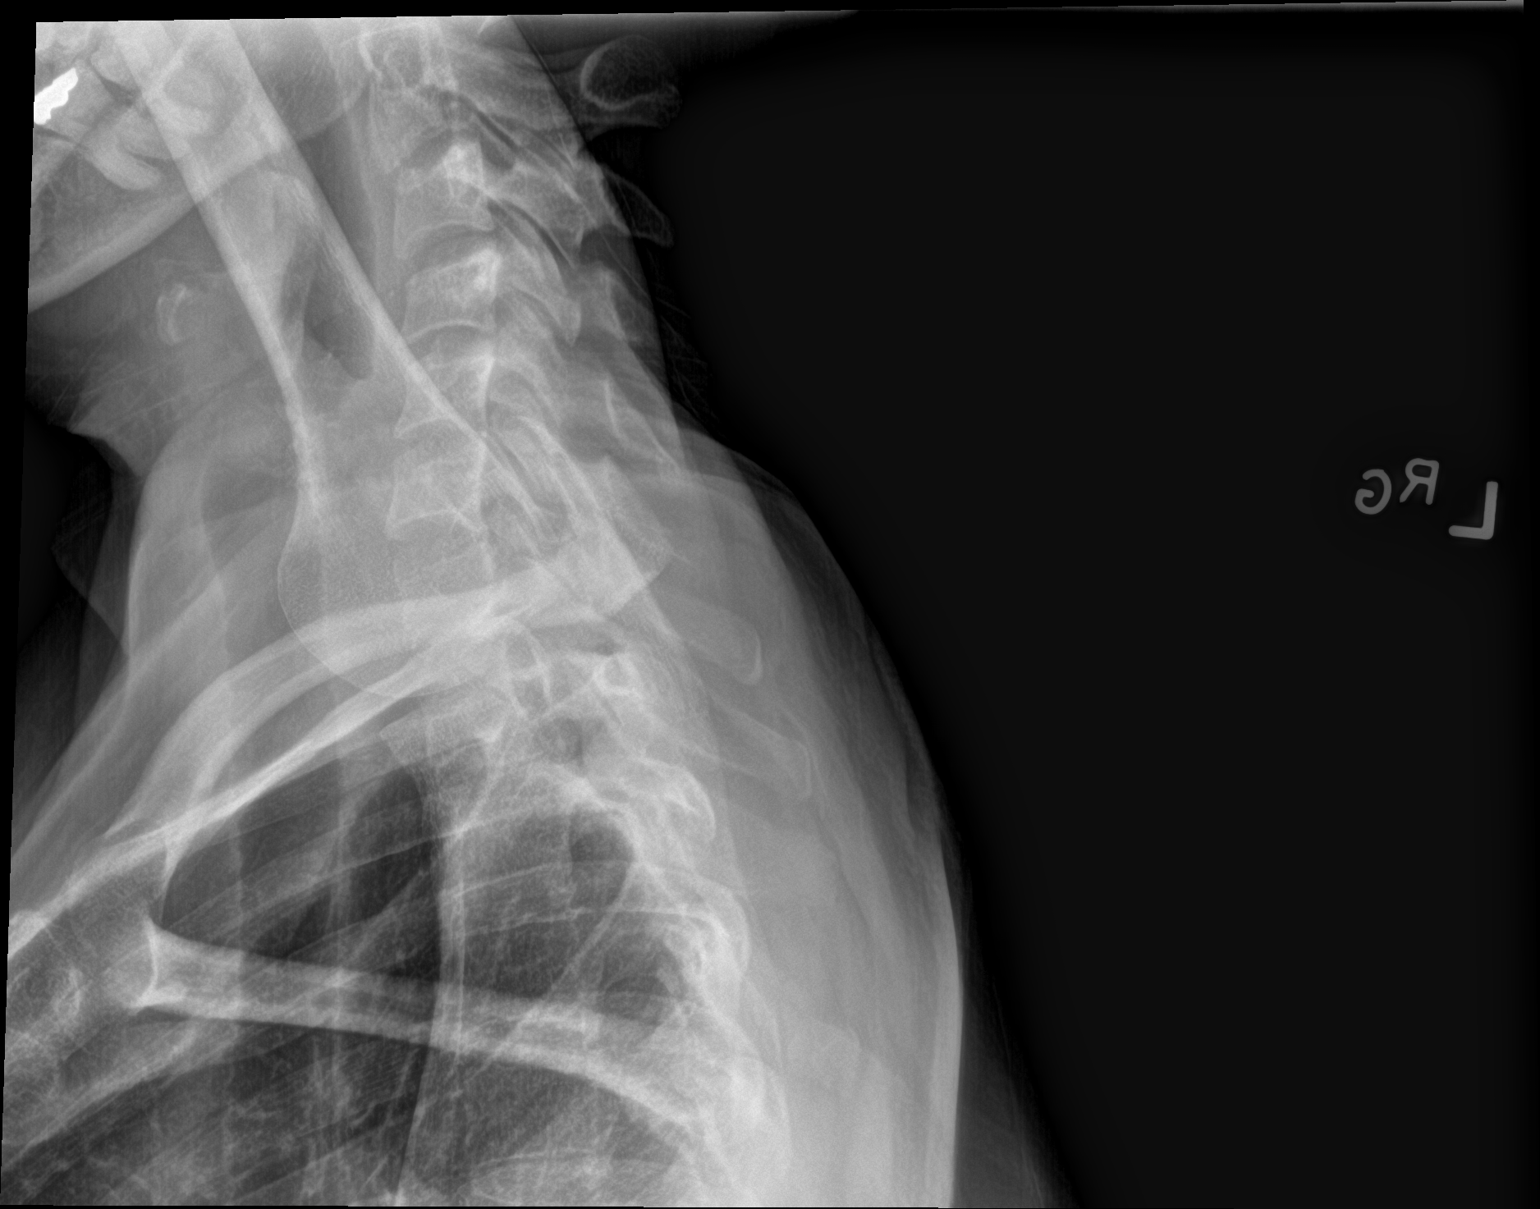

[3 of 3 positions shown; findings below may reference images not displayed]

FINDINGS: Twelve rib-bearing thoracic vertebrae with anatomic posterior
alignment. Very slight, insignificant thoracic dextroscoliosis. No
fractures. No evidence of spondylosis. Pedicles intact. lower
cervical spine unremarkable on the swimmer's view.
IMPRESSION: No acute, subacute or significant osseous abnormality.

## 2019-06-10 ENCOUNTER — Other Ambulatory Visit: Payer: Self-pay

## 2019-06-10 ENCOUNTER — Encounter: Payer: Self-pay | Admitting: Cardiology

## 2019-06-10 ENCOUNTER — Ambulatory Visit (INDEPENDENT_AMBULATORY_CARE_PROVIDER_SITE_OTHER): Payer: No Typology Code available for payment source | Admitting: Cardiology

## 2019-06-10 VITALS — BP 124/82 | HR 70 | Temp 97.1°F | Ht 64.0 in | Wt 133.0 lb

## 2019-06-10 DIAGNOSIS — R002 Palpitations: Secondary | ICD-10-CM

## 2019-06-10 DIAGNOSIS — Z8616 Personal history of COVID-19: Secondary | ICD-10-CM

## 2019-06-10 DIAGNOSIS — Z7189 Other specified counseling: Secondary | ICD-10-CM

## 2019-06-10 NOTE — Patient Instructions (Signed)
Medication Instructions:  Your Physician recommend you continue on your current medication as directed.    *If you need a refill on your cardiac medications before your next appointment, please call your pharmacy*   Lab Work: None   Testing/Procedures: Our physician has recommended that you wear an [place] monitor. The Zio patch cardiac monitor continuously records heart rhythm data for up to 14 days, this is for patients being evaluated for multiple types heart rhythms. For the first 24 hours post application, please avoid getting the Zio monitor wet in the shower or by excessive sweating during exercise. After that, feel free to carry on with regular activities. Keep soaps and lotions away from the ZIO XT Patch.   Someone from our office will call to mail monitor    Follow-Up: At [facility], you and your health needs are our priority.  As part of our continuing mission to provide you with exceptional heart care, we have created designated Provider Care Teams.  These Care Teams include your primary Cardiologist (physician) and Advanced Practice Providers (APPs -  Physician Assistants and Nurse Practitioners) who all work together to provide you with the care you need, when you need it.  We recommend signing up for the patient portal called "MyChart".  Sign up information is provided on this After Visit Summary.  MyChart is used to connect with patients for Virtual Visits (Telemedicine).  Patients are able to view lab/test results, encounter notes, upcoming appointments, etc.  Non-urgent messages can be sent to your provider as well.   To learn more about what you can do with MyChart, go to [email or web address].    Your next appointment:   As needed  The format for your next appointment:   Either In Person or Virtual  Provider:   Buford Dresser, MD  [place] Instructions   Your physician has requested you wear your ZIO patch  monitor___7____days.   This is a single patch monitor.  Ir[employer] supplies one patch monitor per enrollment.  Additional stickers are not available.   Please do not apply patch if you will be having a Nuclear Stress Test, Echocardiogram, Cardiac CT, MRI, or Chest Xray during the time frame you would be wearing the monitor. The patch cannot be worn during these tests.  You cannot remove and re-apply the ZIO XT patch monitor.   Your ZIO patch monitor will be sent USPS Priority mail from [facility] directly to your home address. The monitor may also be mailed to a PO BOX if home delivery is not available.   It may take 3-5 days to receive your monitor after you have been enrolled.   Once you have received you monitor, please review enclosed instructions.  Your monitor has already been registered assigning a specific monitor serial # to you.   Applying the monitor   Shave hair from upper left chest.   Hold abrader disc by orange tab.  Rub abrader in 40 strokes over left upper chest as indicated in your monitor instructions.   Clean area with 4 enclosed alcohol pads .  Use all pads to assure are is cleaned thoroughly.  Let dry.   Apply patch as indicated in monitor instructions.  Patch will be place under collarbone on left side of chest with arrow pointing upward.   Rub patch adhesive wings for 2 minutes.Remove white label marked "1".  Remove white label marked "2".  Rub patch adhesive wings for 2 additional minutes.  While looking in a mirror, press and release button in center of patch.  A small green light will flash 3-4 times .  This will be your only indicator the monitor has been turned on.     Do not shower for the first 24 hours.  You may shower after the first 24 hours.   Press button if you feel a symptom. You will hear a small click.  Record Date, Time and Symptom in the Patient Log Book.   When you are ready to remove patch, follow instructions on last 2 pages of Patient  Log Book.  Stick patch monitor onto last page of Patient Log Book.   Place Patient Log Book in Blue box.  Use locking tab on box and tape box closed securely.  The Orange and White box has prepaid postage on it.  Please place in mailbox as soon as possible.  Your physician should have your test results approximately 7 days after the monitor has been mailed back to [employer].   Call [employer] Customer Care at [phone] if you have questions regarding your ZIO XT patch monitor.  Call them immediately if you see an orange light blinking on your monitor.   If your monitor falls off in less than 4 days contact our Monitor department at [phone].  If your monitor becomes loose or falls off after 4 days call [employer] at [phone] for suggestions on securing your monitor.     

## 2019-06-10 NOTE — Progress Notes (Signed)
Cardiology Office Note:    Date:  06/10/2019   ID:  Christine Rosales, DOB 06/06/1975, MRN [id number]  PCP:  Mackie Pai, PA-C  Cardiologist:  Buford Dresser, MD  Referring MD: Mackie Pai, PA-C   CC: follow up  History of Present Illness:    Christine Rosales is a 44 y.o. female with a hx of palpitations, anxiety, fibromyalgia who is seen as a new patient to me today. She was last seen by Dr. Saunders Revel 11/04/2016 for palpitations and chest pain.  Interval history: She had Covid 12/2018.  She saw Biagio Borg 06/05/19 and noted palpitations after eating. She was prescribed famotidine and instructed to follow up with cardiology if this did not improve. She was also seen in the ER 02/08/19 with palpitations and hypertension. ECG sinus tachycardia, improved symptoms with IV fluids.  Today: Here with interpreter Ulanda Edison today, [employer].  Notes that every time she eats, she has about 5 minutes of hard heart beats. Walks and it gets better. No shortness of breath, no nausea/diaphoresis. Has diffuse chest pain, sometimes with these events, sometimes at different times. In the past had costochondritis, but this feels somewhat different. These events happening in the morning and around 5 pm every day. Better after she takes her alprazolam. Lasts only moments but recurs.   Denies shortness of breath at rest or with normal exertion. No PND, orthopnea, LE edema or unexpected weight gain. No syncope.  With her Covid in November 2020, had "all the symptoms." Did not require hospitalization.   Past Medical History:  Diagnosis Date  . Anxiety   . Asthma   . Fibromyalgia   . Headache   . Palpitations   . TMJ (dislocation of temporomandibular joint)     Past Surgical History:  Procedure Laterality Date  . NO PAST SURGERIES      Current Medications: Current Outpatient Medications on File Prior to Visit  Medication Sig  . albuterol (PROVENTIL  HFA;VENTOLIN HFA) 108 (90 Base) MCG/ACT inhaler Inhale 2 puffs into the lungs every 6 (six) hours as needed for wheezing or shortness of breath. Can give proair.  Marland Kitchen ALPRAZolam (XANAX) 0.25 MG tablet 1 tab daily if needed  . atenolol (TENORMIN) 25 MG tablet Take 1 tablet daily as needed for palpitations  . ezetimibe (ZETIA) 10 MG tablet Take 1 tablet (10 mg total) by mouth daily.  . famotidine (PEPCID) 20 MG tablet Take 1 tablet (20 mg total) by mouth 2 (two) times daily.  . fluticasone (FLONASE) 50 MCG/ACT nasal spray 2 sprays by Each Nare route daily.   No current facility-administered medications on file prior to visit.     Allergies:   Sulfa antibiotics, Codeine, and Sudafed [pseudoephedrine]   Social History   Tobacco Use  . Smoking status: Never Smoker  . Smokeless tobacco: Never Used  Substance Use Topics  . Alcohol use: No  . Drug use: No    Family History: family history includes Depression in her mother and sister; Heart murmur in her sister; Hypertension in her father and mother; Stroke in her mother.  ROS:   Please see the history of present illness.  Additional pertinent ROS otherwise unremarkable.  EKGs/Labs/Other Studies Reviewed:    The following studies were reviewed today: ETT 2016-07-14  Blood pressure demonstrated a normal response to exercise.  There was no ST segment deviation noted during stress.   No evidence for ischemia by ST segment analysis.  Mildly impaired exercise tolerance.   Echo 01/22/16 -  Left ventricle: The cavity size was normal. Systolic function was  normal. The estimated ejection fraction was in the range of 60%  to 65%. Wall motion was normal; there were no regional wall  motion abnormalities. Left ventricular diastolic function  parameters were normal.  - Aortic valve: Trileaflet; normal thickness leaflets. There was no  regurgitation.  - Aortic root: The aortic root was normal in size.  - Ascending aorta: The ascending  aorta was normal in size.  - Mitral valve: Structurally normal valve. There was trivial  regurgitation.  - Right ventricle: Systolic function was normal.  - Right atrium: The atrium was normal in size.  - Tricuspid valve: There was trivial regurgitation.  - Pulmonary arteries: Systolic pressure was within the normal  range.  - Inferior vena cava: The vessel was normal in size.  - Pericardium, extracardiac: There was no pericardial effusion.   Monitor 12/10/2015  The patient was monitored for 14 days.  The predominant rhythm was sinus with an average rate of 89 bpm. Sinus tachycardia (HR>100 bpm) was present for 17% of the monitoring period.  No significant supraventricular or ventricular ectopy was observed.  There were no sustained arrhythmias or pauses.  Reported symptoms correspond to normal sinus rhythm and sinus tachycardia.  EKG:  EKG is personally reviewed.  The ekg ordered today demonstrates sinus rhythm at 70 bpm, nonspecific t wave flattening  Recent Labs: 02/08/2019: Hemoglobin 13.4; Platelets 278 02/19/2019: ALT 8; BUN 11; Creatinine, Ser 0.77; Potassium 4.3; Sodium 139  Recent Lipid Panel    Component Value Date/Time   CHOL 201 (H) 02/19/2019 1055   TRIG 114.0 02/19/2019 1055   HDL 53.00 02/19/2019 1055   CHOLHDL 4 02/19/2019 1055   VLDL 22.8 02/19/2019 1055   LDLCALC 125 (H) 02/19/2019 1055    Physical Exam:    VS:  BP 124/82   Pulse 70   Temp (!) 97.1 F (36.2 C)   Ht 5\' 4"  (1.626 m)   Wt 133 lb (60.3 kg)   SpO2 98%   BMI 22.83 kg/m     Wt Readings from Last 3 Encounters:  06/10/19 133 lb (60.3 kg)  06/05/19 128 lb 6.4 oz (58.2 kg)  04/09/19 131 lb 12.8 oz (59.8 kg)    GEN: Well nourished, well developed in no acute distress HEENT: Normal, moist mucous membranes NECK: No JVD CARDIAC: regular rhythm, normal S1 and S2, no rubs or gallops. No murmurs. VASCULAR: Radial and DP pulses 2+ bilaterally. No carotid bruits RESPIRATORY:  Clear to  auscultation without rales, wheezing or rhonchi  ABDOMEN: Soft, non-tender, non-distended MUSCULOSKELETAL:  Ambulates independently SKIN: Warm and dry, no edema NEUROLOGIC:  Alert and oriented x 3. No focal neuro deficits noted. PSYCHIATRIC:  Normal affect    ASSESSMENT:    1. Palpitation   2. Cardiac risk counseling   3. Counseling on health promotion and disease prevention   4. History of 2019 novel coronavirus disease (COVID-19)    PLAN:    Palpitations: -ECG is sinus today -will arrange for 7 day Zio, instructed on use today -instructed on red flag warning signs that need immediate medical attention -has a history of Covid. Discussed that this can have as yet unclear long term side effects, including palpitations.  Cardiac risk counseling and prevention recommendations: -recommend heart healthy/Mediterranean diet, with whole grains, fruits, vegetable, fish, lean meats, nuts, and olive oil. Limit salt. -recommend moderate walking, 3-5 times/week for 30-50 minutes each session. Aim for at least 150 minutes.week. Goal should  be pace of 3 miles/hours, or walking 1.5 miles in 30 minutes -recommend avoidance of tobacco products. Avoid excess alcohol. -ASCVD risk score: The 10-year ASCVD risk score Mikey Bussing DC Brooke Bonito., et al., 2013) is: 0.8%   Values used to calculate the score:     Age: 64 years     Sex: Female     Is Non-Hispanic African American: No     Diabetic: No     Tobacco smoker: No     Systolic Blood Pressure: [id number] mmHg     Is BP treated: Yes     HDL Cholesterol: 53 mg/dL     Total Cholesterol: 201 mg/dL    Plan for follow up: to be determined based on results of monitor  Buford Dresser, MD, PhD [place]  [facility]    Medication Adjustments/Labs and Tests Ordered: Current medicines are reviewed at length with the patient today.  Concerns regarding medicines are outlined above.  Orders Placed This Encounter  Procedures  . LONG TERM MONITOR (3-14 DAYS)  .  EKG 12-Lead   No orders of the defined types were placed in this encounter.   Patient Instructions  Medication Instructions:  Your Physician recommend you continue on your current medication as directed.    *If you need a refill on your cardiac medications before your next appointment, please call your pharmacy*   Lab Work: None   Testing/Procedures: Our physician has recommended that you wear an [place] monitor. The Zio patch cardiac monitor continuously records heart rhythm data for up to 14 days, this is for patients being evaluated for multiple types heart rhythms. For the first 24 hours post application, please avoid getting the Zio monitor wet in the shower or by excessive sweating during exercise. After that, feel free to carry on with regular activities. Keep soaps and lotions away from the ZIO XT Patch.   Someone from our office will call to mail monitor    Follow-Up: At [facility], you and your health needs are our priority.  As part of our continuing mission to provide you with exceptional heart care, we have created designated Provider Care Teams.  These Care Teams include your primary Cardiologist (physician) and Advanced Practice Providers (APPs -  Physician Assistants and Nurse Practitioners) who all work together to provide you with the care you need, when you need it.  We recommend signing up for the patient portal called "MyChart".  Sign up information is provided on this After Visit Summary.  MyChart is used to connect with patients for Virtual Visits (Telemedicine).  Patients are able to view lab/test results, encounter notes, upcoming appointments, etc.  Non-urgent messages can be sent to your provider as well.   To learn more about what you can do with MyChart, go to [email or web address].    Your next appointment:   As needed  The format for your next appointment:   Either In Person or Virtual  Provider:   Buford Dresser, MD  [place] Instructions   Your physician has requested you wear your ZIO patch monitor___7____days.   This is a single patch monitor.  [employer] supplies one patch monitor per enrollment.  Additional stickers are not available.   Please do not apply patch if you will be having a Nuclear Stress Test, Echocardiogram, Cardiac CT, MRI, or Chest Xray during the time frame you would be wearing the monitor. The patch cannot be worn during these tests.  You cannot remove and re-apply  the ZIO XT patch monitor.   Your ZIO patch monitor will be sent USPS Priority mail from [facility] directly to your home address. The monitor may also be mailed to a PO BOX if home delivery is not available.   It may take 3-5 days to receive your monitor after you have been enrolled.   Once you have received you monitor, please review enclosed instructions.  Your monitor has already been registered assigning a specific monitor serial # to you.   Applying the monitor   Shave hair from upper left chest.   Hold abrader disc by orange tab.  Rub abrader in 40 strokes over left upper chest as indicated in your monitor instructions.   Clean area with 4 enclosed alcohol pads .  Use all pads to assure are is cleaned thoroughly.  Let dry.   Apply patch as indicated in monitor instructions.  Patch will be place under collarbone on left side of chest with arrow pointing upward.   Rub patch adhesive wings for 2 minutes.Remove white label marked "1".  Remove white label marked "2".  Rub patch adhesive wings for 2 additional minutes.   While looking in a mirror, press and release button in center of patch.  A small green light will flash 3-4 times .  This will be your only indicator the monitor has been turned on.     Do not shower for the first 24 hours.  You may shower after the first 24 hours.   Press button if you feel a symptom. You will hear a small click.  Record Date, Time and Symptom in the Patient Log  Book.   When you are ready to remove patch, follow instructions on last 2 pages of Patient Log Book.  Stick patch monitor onto last page of Patient Log Book.   Place Patient Log Book in [place] box.  Use locking tab on box and tape box closed securely.  The Orange and [employer] has [employer] on it.  Please place in mailbox as soon as possible.  Your physician should have your test results approximately 7 days after the monitor has been mailed back to [facility].   Call [place] at [phone] if you have questions regarding your ZIO XT patch monitor.  Call them immediately if you see an orange light blinking on your monitor.   If your monitor falls off in less than 4 days contact our Monitor department at [phone].  If your monitor becomes loose or falls off after 4 days call [employer] at [phone] for suggestions on securing your monitor.      Signed, Buford Dresser, MD PhD 06/10/2019  [place]

## 2019-06-20 ENCOUNTER — Other Ambulatory Visit (INDEPENDENT_AMBULATORY_CARE_PROVIDER_SITE_OTHER): Payer: No Typology Code available for payment source

## 2019-06-20 DIAGNOSIS — R002 Palpitations: Secondary | ICD-10-CM | POA: Diagnosis not present

## 2019-07-24 ENCOUNTER — Encounter: Payer: Self-pay | Admitting: Cardiology

## 2019-08-30 ENCOUNTER — Ambulatory Visit (INDEPENDENT_AMBULATORY_CARE_PROVIDER_SITE_OTHER): Payer: No Typology Code available for payment source | Admitting: Medical

## 2019-08-30 ENCOUNTER — Ambulatory Visit (HOSPITAL_BASED_OUTPATIENT_CLINIC_OR_DEPARTMENT_OTHER)
Admission: RE | Admit: 2019-08-30 | Discharge: 2019-08-30 | Disposition: A | Discharge status: Home / Self Care | Payer: No Typology Code available for payment source | Source: Ambulatory Visit | Attending: Medical | Admitting: Medical

## 2019-08-30 ENCOUNTER — Other Ambulatory Visit: Payer: Self-pay

## 2019-08-30 VITALS — BP 123/74 | HR 84 | Resp 18 | Ht 64.0 in | Wt 139.8 lb

## 2019-08-30 DIAGNOSIS — M79671 Pain in right foot: Secondary | ICD-10-CM | POA: Insufficient documentation

## 2019-08-30 DIAGNOSIS — R002 Palpitations: Secondary | ICD-10-CM

## 2019-08-30 DIAGNOSIS — M79672 Pain in left foot: Secondary | ICD-10-CM | POA: Insufficient documentation

## 2019-08-30 MED ORDER — ALBUTEROL SULFATE HFA 108 (90 BASE) MCG/ACT IN AERS: 2.0000 | INHALATION_SPRAY | Freq: Four times a day (QID) | RESPIRATORY_TRACT | 0 refills | Status: AC | PRN

## 2019-08-30 MED ORDER — ATENOLOL 25 MG PO TABS: ORAL_TABLET | ORAL | 2 refills | Status: AC

## 2019-08-30 MED ORDER — FAMOTIDINE 20 MG PO TABS: 20.0000 mg | ORAL_TABLET | Freq: Two times a day (BID) | ORAL | 0 refills | Status: AC

## 2019-08-30 MED ORDER — DICLOFENAC SODIUM 75 MG PO TBEC: 75.0000 mg | DELAYED_RELEASE_TABLET | Freq: Two times a day (BID) | ORAL | 0 refills | Status: AC

## 2019-08-30 MED FILL — DICLOFENAC SODIUM 75 MG TAB: 75 | 10 days supply | Qty: 20 | Fill #0

## 2019-08-30 MED FILL — ATENOLOL 25 MG TABLET: 25 | 30 days supply | Qty: 30 | Fill #0

## 2019-08-30 MED FILL — FAMOTIDINE 20 MG TABS: 20 | 30 days supply | Qty: 60 | Fill #0

## 2019-08-30 MED FILL — ALBUTEROL SULFATE HFA 108 (: 108 (90 BAS | 25 days supply | Qty: 9 | Fill #0

## 2019-08-30 NOTE — Patient Instructions (Addendum)
For bilateral foot pain will get xray of both feet. Recommend use Dr. Zoe Lan heel pads daily. Calf stretches. Use diclofenac. Rx sent to pharmacy. May have plantar fascitis.  For history of low hormone levels and pellets use I want you to bring by your prior labs before treatment. Not sure you should continue on that treatment. Might get opinion of endocrinologist in our office.  Follow up date to be determined. May go ahead and refer to podiatrist since already 3 weeks. Will see how you do with above treatment.   Fascitis plantar Plantar Fasciitis  La fascitis plantar es una afeccin dolorosa que se produce en el taln. Ocurre cuando la banda de tejido que [employer] dedos con el hueso del taln (fascia plantar) se irrita. Esto puede ocurrir por Financial planner ejercicio u otras actividades repetitivas (lesin por uso excesivo). El dolor de la fascitis plantar puede abarcar desde una leve irritacin a dolor intenso, y en los casos ms agudos puede dificultar que la persona camine o se Murphy. Por lo general, el dolor es peor a la maana despus de dormir, o despus de Public affairs consultant sentado o acostado durante un tiempo. El dolor tambin puede empeorar despus de caminar o estar de pie por [employer]. Cules son las causas? Esta afeccin puede ser causada por lo siguiente:  Estar de pie durante largos perodos.  Usar zapatos que no tengan un buen soporte para el arco.  Practicar actividades que ponen tensin en las articulaciones (actividades de alto impacto), como correr, Field seismologist [place] Arts administrator.  Tener sobrepeso.  Tener una forma de caminar (un andar) anormal.  Presentar rigidez muscular en la parte posterior de la parte inferior de la pierna (pantorrilla).  Tener un arco alto de los pies.  Comenzar una nueva actividad fsica. Cules son los signos o los sntomas? El sntoma principal de esta afeccin es el dolor en el taln. El dolor:  Puede empeorar con los  primeros pasos luego de estar en reposo, especialmente por la maana despus de dormir o de haber estado sentado o acostado durante [place].  Puede empeorar despus de largos perodos de estar parado.  Puede disminuir despus de 30 a 45 minutos de [place], como caminar apaciblemente. Cmo se diagnostica? Esta afeccin puede diagnosticarse en funcin de sus antecedentes mdicos y sus sntomas. Su mdico puede preguntarle sobre su nivel de [place]. El Special educational needs teacher un examen fsico para controlar si tiene lo siguiente:  Un rea dolorida en la parte inferior del pie.  El arco alto.  Dolor al Agricultural consultant.  Dificultad para mover el pie. Pueden realizarle estudios de diagnstico por imagen para confirmar el diagnstico, por ejemplo:  Radiografas.  Ecografa.  Resonancia magntica (RM). Cmo se trata? El tratamiento de la fascitis plantar depende de la gravedad de su afeccin. El tratamiento puede incluir lo siguiente:  Reposo, hielo, presin (compresin) y Lexicographer el pie afectado (elevacin). Esto puede conocerse como [place] de RHCE (Reposo, hielo, compresin, elevacin). El mdico puede recomendarle Terapia de RHCE junto con medicamentos de venta libre para Best boy.  Ejercicios para [place] pantorrillas y la fascia plantar.  Una frula que [employer] estirado y [place] mientras usted duerme (frula nocturna).  Fisioterapia para [employer] sntomas y Customer service manager en el futuro.  Inyecciones de corticoesteroides para Best boy y la inflamacin.  Estimular su fascia plantar lesionada con impulsos elctricos (tratamiento con ondas de choque extracorprea). Esto generalmente es la ltima opcin de  tratamiento antes de la [place].  [place], si los otros tratamientos no han funcionado despus de 19meses. Siga estas indicaciones en su casa:  Control del dolor, la rigidez y la hinchazn  Si se lo indican, aplique hielo sobre la zona  del dolor: ? Ponga el hielo en una bolsa de plstico o use una botella de agua congelada. ? Coloque una [employer] piel y la bolsa de hielo o la botella. ? Frote la parte inferior del pie sobre la bolsa o la botella. ? Haga esto durante 2minutos, de 2a 3veces al [employer].  Use calzado deportivo con amortiguacin de aire o gel, o intente usar plantillas blandas diseadas para la fascitis plantar.  Cuando est sentado o acostado, levante (eleve) el pie por encima del nivel del corazn. Actividad  Evite las actividades que causan dolor. Pregntele al mdico qu actividades son seguras para usted.  Haga los ejercicios de fisioterapia y estiramiento como se lo haya indicado el mdico.  Intente hacer actividades y tipos de ejercicio que sean ms fciles para las articulaciones (de bajo impacto). Por ejemplo, nadar, hacer ejercicios [employer], y Catering manager. Instrucciones generales  Del[employer] de venta libre y los recetados solamente como se lo haya indicado el mdico.  Si el mdico se lo indica, use una frula nocturna para dormir. Afloje la frula si los dedos de los pies se le entumecen, siente hormigueos o se le enfran y se tornan de Optician, dispensing.  Mantenga un peso saludable, o colabore con su mdico para perder [employer].  Concurra a todas las visitas de control como se lo haya indicado el mdico. Esto es importante. Comunquese con un mdico si:  Tiene sntomas que no desaparecen despus tratarlos en su casa.  Siente un dolor que [place].  Siente un dolor que afecta su capacidad de moverse o de Optometrist sus actividades diarias. Resumen  La fascitis plantar es una afeccin dolorosa que se produce en el taln. Ocurre cuando la banda de tejido que [employer] dedos con el hueso del taln (fascia plantar) se irrita.  El sntoma principal de esta afeccin es Conservation officer, historic buildings en el taln que puede empeorar despus de hacer mucho ejercicio o de Public affairs consultant parado por  mucho tiempo.  El tratamiento vara, pero por lo general comienza con reposo, hielo, compresin, y elevacin (Tratamiento de RHCE) y los medicamentos de venta libre para Financial controller. Esta informacin no tiene Marine scientist el consejo del mdico. Asegrese de hacerle al mdico cualquier pregunta que tenga. Document Revised: 04/29/2017 Document Reviewed: 01/08/2017 Elsevier Patient Education  [place].

## 2019-08-30 NOTE — Progress Notes (Signed)
   Subjective:    Patient ID: Christine Rosales, female    DOB: Mar 06, 1975, 44 y.o.   MRN: [id number]  HPI  Pt in with pain in both feet for past 3 weeks. Present when she walks.  No trauma associated with foot pain. Pain came on slowly. Pain is symmetric. No back pain reported.   Pt states she gained wait after estrogen and testosterone pellet. Pt states gained about 10 pounds since this. She had this done at [facility]. They are following her hormone levels. They also gave progesterone.  Pt told the hormones will last for 3 months then may need injection again in 3 months. Hot flashes stopped. More mental clarity. But frustrated by weight gain and feeling bloated.      Review of Systems     Objective:   Physical Exam  General- No acute distress. Pleasant patient. Neck- Full range of motion, no jvd Lungs- Clear, even and unlabored. Heart- regular rate and rhythm. Neurologic- CNII- XII grossly intact.  Lower ext- both feet normal pulses. Normal sensation. No pain on palpation. When has pain states bottoms hurt.       Assessment & Plan:  For bilateral foot pain will get xray of both feet. Recommend use Dr. Zoe Lan heel pads daily. Calf stretches. Use diclofenac. Rx sent to pharmacy. May have plantar fascitis.  For history of low hormone levels and pellets use I want you to bring by your prior labs before treatment. Not sure you should continue on that treatment. Might get opinion of endocrinologist in our office.  Follow up date to be determined. May go ahead and refer to podiatrist since already 3 weeks. Will see how you do with above treatment.  Mackie Pai, PA-C   Time spent with patient today was  25  minutes which consisted of chart review, discussing diagnoses, work up, treatment, answering questions  and documentation.

## 2019-09-05 ENCOUNTER — Telehealth: Payer: Self-pay | Admitting: Medical

## 2019-09-05 NOTE — Telephone Encounter (Signed)
I reviewed pt labs from specialist who gave her on hormone pellets. Did she do this at her gynecologist office or specialty hormone replacement clnic?  Who is her gyn.  I was thinking of referring to other gynecologist for second  opinion on different options.   Let me know what she says.  I think I remember her saying she left her gyn to do pellet treatments which including some testosterone supplemenation in addition to estrogen.

## 2019-09-06 NOTE — Telephone Encounter (Signed)
Patient called back , was unable to understand me so Christine Rosales called her and left a voicemail to return call.

## 2019-09-06 NOTE — Telephone Encounter (Signed)
Left message on machine to call back  

## 2019-09-06 NOTE — Telephone Encounter (Signed)
Spoke with pt, pt informed went to OBGYN Dr Azucena Fallen From [place] who recommended pt to go to [facility] (Got treatment done with Carollee Leitz from [address]  to get her pellets done, pt mentioned wants to get her levels check (lab orders) only to know if the treatment that she had with pellets Testerone is ok to continue with since she is starting to gain weight and having some effects. Pt states she brought copies of her visit with [place] to our office but if we need them again she has copies at home and can bring them in office. (pt mentioned does not want to be sent to another OBGYN). Please advise.

## 2019-09-19 NOTE — Telephone Encounter (Signed)
Patient advised of provider's comments and recommendations.

## 2019-09-19 NOTE — Telephone Encounter (Signed)
Did review labs she gave me.  Let pt know that she should let [facility] who did hormone pellet treatment know  of gaining weight or other side effects.  If she does further treatments she should ask if they recommend dose reduction to avoid side effects  or just not using testosterone at all.

## 2019-10-01 ENCOUNTER — Telehealth: Payer: Self-pay | Admitting: Medical

## 2019-10-01 NOTE — Telephone Encounter (Signed)
Error

## 2019-10-02 ENCOUNTER — Emergency Department (HOSPITAL_COMMUNITY): Payer: No Typology Code available for payment source

## 2019-10-02 ENCOUNTER — Other Ambulatory Visit: Payer: Self-pay

## 2019-10-02 ENCOUNTER — Encounter (HOSPITAL_COMMUNITY): Payer: Self-pay

## 2019-10-02 ENCOUNTER — Emergency Department (HOSPITAL_COMMUNITY)
Admission: EM | Admit: 2019-10-02 | Discharge: 2019-10-03 | Disposition: A | Discharge status: Home / Self Care | Payer: No Typology Code available for payment source | Attending: Emergency Medicine | Admitting: Emergency Medicine

## 2019-10-02 ENCOUNTER — Ambulatory Visit (INDEPENDENT_AMBULATORY_CARE_PROVIDER_SITE_OTHER): Payer: No Typology Code available for payment source | Admitting: Medical

## 2019-10-02 VITALS — BP 129/89 | HR 73 | Resp 18 | Ht 64.0 in | Wt 139.2 lb

## 2019-10-02 DIAGNOSIS — M79672 Pain in left foot: Secondary | ICD-10-CM | POA: Diagnosis not present

## 2019-10-02 DIAGNOSIS — R079 Chest pain, unspecified: Secondary | ICD-10-CM | POA: Diagnosis present

## 2019-10-02 DIAGNOSIS — R0789 Other chest pain: Secondary | ICD-10-CM

## 2019-10-02 DIAGNOSIS — M79662 Pain in left lower leg: Secondary | ICD-10-CM | POA: Diagnosis not present

## 2019-10-02 DIAGNOSIS — R7989 Other specified abnormal findings of blood chemistry: Secondary | ICD-10-CM

## 2019-10-02 DIAGNOSIS — M7918 Myalgia, other site: Secondary | ICD-10-CM | POA: Diagnosis not present

## 2019-10-02 DIAGNOSIS — M79605 Pain in left leg: Secondary | ICD-10-CM | POA: Insufficient documentation

## 2019-10-02 DIAGNOSIS — Z79899 Other long term (current) drug therapy: Secondary | ICD-10-CM | POA: Diagnosis not present

## 2019-10-02 DIAGNOSIS — J45909 Unspecified asthma, uncomplicated: Secondary | ICD-10-CM | POA: Diagnosis not present

## 2019-10-02 DIAGNOSIS — M79671 Pain in right foot: Secondary | ICD-10-CM | POA: Insufficient documentation

## 2019-10-02 DIAGNOSIS — M79661 Pain in right lower leg: Secondary | ICD-10-CM

## 2019-10-02 DIAGNOSIS — M79604 Pain in right leg: Secondary | ICD-10-CM | POA: Insufficient documentation

## 2019-10-02 LAB — I-STAT CHEM 8, ED
BUN: 13 mg/dL (ref 6–20)
Calcium, Ion: 1.19 mmol/L (ref 1.15–1.40)
Chloride: 100 mmol/L (ref 98–111)
Creatinine, Ser: 0.8 mg/dL (ref 0.44–1.00)
Glucose, Bld: 100 mg/dL — ABNORMAL HIGH (ref 70–99)
HCT: 43 % (ref 36.0–46.0)
Hemoglobin: 14.6 g/dL (ref 12.0–15.0)
Potassium: 3.5 mmol/L (ref 3.5–5.1)
Sodium: 140 mmol/L (ref 135–145)
TCO2: 27 mmol/L (ref 22–32)

## 2019-10-02 LAB — CBC
HCT: 43.6 % (ref 36.0–46.0)
Hemoglobin: 14.5 g/dL (ref 12.0–15.0)
MCH: 30.8 pg (ref 26.0–34.0)
MCHC: 33.3 g/dL (ref 30.0–36.0)
MCV: 92.6 fL (ref 80.0–100.0)
Platelets: 335 10*3/uL (ref 150–400)
RBC: 4.71 MIL/uL (ref 3.87–5.11)
RDW: 12.7 % (ref 11.5–15.5)
WBC: 9.1 10*3/uL (ref 4.0–10.5)
nRBC: 0 % (ref 0.0–0.2)

## 2019-10-02 LAB — BASIC METABOLIC PANEL
Anion gap: 9 (ref 5–15)
BUN: 11 mg/dL (ref 6–20)
CO2: 27 mmol/L (ref 22–32)
Calcium: 9.2 mg/dL (ref 8.9–10.3)
Chloride: 103 mmol/L (ref 98–111)
Creatinine, Ser: 0.94 mg/dL (ref 0.44–1.00)
GFR calc Af Amer: >60 mL/min (ref >60)
GFR calc non Af Amer: >60 mL/min (ref >60)
Glucose, Bld: 105 mg/dL — ABNORMAL HIGH (ref 70–99)
Potassium: 3.6 mmol/L (ref 3.5–5.1)
Sodium: 139 mmol/L (ref 135–145)

## 2019-10-02 LAB — I-STAT BETA HCG BLOOD, ED (MC, WL, AP ONLY): I-stat hCG, quantitative: <5 m[IU]/mL (ref <5)

## 2019-10-02 LAB — TROPONIN I (HIGH SENSITIVITY): Troponin I (High Sensitivity): 4 ng/L (ref <18)

## 2019-10-02 MED ORDER — IOHEXOL 350 MG/ML SOLN
55.0000 mL | Freq: Once | INTRAVENOUS | Status: AC | PRN
  Administered 2019-10-02: 55 mL via INTRAVENOUS

## 2019-10-02 NOTE — Patient Instructions (Addendum)
Patient with bilateral mild calf soreness with elevated D-dimer.  [facility]  provider did ultrasounds and told her no DVTs on either extremity.  Her oxygen saturations are very good and no obvious shortness of breath.  Elevated D-dimer to be nonspecific.  However I do not want to rely solely on an office ultrasound.  Not sure the credentials of the person who did that study.  Will confirm negative study with the radiology read.  I placed order tonight.  However when I called downstairs to schedule no answer.  So went downstairs and talk with radiology technicians.  Apparently there is no ultrasound technician working tonight and there is no Restaurant manager, fast food at [place] center either.  I do not think clinical presentation warrants emergency department evaluation tonight.  However did explain in detail to patient that if she has changing signs and symptoms such as calf swelling or shortness of breath then be seen in the emergency department tonight.  Patient expressed understanding and agree.  Will get staff to call down tomorrow and see if patient can get ultrasounds done either here or [place] [facility].  Follow-up date to be determined after ultrasound review.  Did advise patient going forward not to take any hormone supplementation.  Patient agrees with that advice.   Called [facility] on 9-2. No [place] available at [facility]. But available at [facility]. She will be scheduled today. Radiology will call pt and get scheduled.

## 2019-10-02 NOTE — Progress Notes (Signed)
Subjective:    Patient ID: Christine Rosales, female    DOB: 1975-11-27, 44 y.o.   MRN: [id number]  HPI   Pt in to discuss elevated d dimer. Pt had this test done as follow up to hormone treatments. She was receiving testosterone with estrogen. Her testosterone level is elevated.  [facility] [place] [facility]. No formal report and pt thinks no radiologist review. They told her negative result.   Her D-dimer level was in the 5 range.  Our office lab is closed at time of interview.  So could not be confirmed presently.  Not complaining of any significant shortness of breath or wheezing. She has had some mild slight calf soreness recently.  But no obvious swelling.  No history of DVT.  No history of any long travel reported.  No trauma to lower extremities.      Review of Systems  Constitutional: Negative for chills, fatigue and fever.  Respiratory: Negative for cough, chest tightness, shortness of breath and wheezing.   Cardiovascular: Negative for chest pain and palpitations.  Musculoskeletal:       She states may be some slight bilateral lower rib region pain.  But notes no shortness of breath.  Mild faint calf pain presently.  Skin: Negative for rash.  Hematological: Negative for adenopathy. Does not bruise/bleed easily.  Psychiatric/Behavioral: Negative for behavioral problems and decreased concentration.    Past Medical History:  Diagnosis Date   Anxiety    Asthma    Fibromyalgia    Headache    Palpitations    TMJ (dislocation of temporomandibular joint)      Social History   Socioeconomic History   Marital status: Married    Spouse name: Not on file   Number of children: Not on file   Years of education: Not on file   Highest education level: Not on file  Occupational History   Not on file  Tobacco Use   Smoking status: Never Smoker   Smokeless tobacco: Never Used  Vaping Use   Vaping Use: Never used  Substance and Sexual Activity    Alcohol use: No   Drug use: No   Sexual activity: Yes    Comment: husband has vascetomy  Other Topics Concern   Not on file  Social History Narrative   Born and raised in [place] by parents. Pt has one older sister. Mom suffered from depression and it affected how she raised the pt. Pt has a college degree in Associate Emergency Medicine. Pt moved to [place] in 2009 due to her husband's job. Pt has bee married 14 yrs and has 2 kids. 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    Social Determinants of Health   Financial Resource Strain:    Difficulty of Paying Living Expenses: Not on file  Food Insecurity:    Worried About Charity fundraiser in the Last Year: Not on file   [employer] of Food in the Last Year: Not on file  Transportation Needs:    Lack of Transportation (Medical): Not on file   Lack of Transportation (Non-Medical): Not on file  Physical Activity:    Days of Exercise per Week: Not on file   Minutes of Exercise per Session: Not on file  Stress:    Feeling of Stress : Not on file  Social Connections:    Frequency of Communication with Friends and Family: Not on file   Frequency of  Social Gatherings with Friends and Family: Not on file   Attends Religious Services: Not on file   Active Member of Clubs or Organizations: Not on file   Attends Archivist Meetings: Not on file   Marital Status: Not on file  Intimate Partner Violence:    Fear of Current or Ex-Partner: Not on file   Emotionally Abused: Not on file   Physically Abused: Not on file   Sexually Abused: Not on file    Past Surgical History:  Procedure Laterality Date   NO PAST SURGERIES      Family History  Problem Relation Age of Onset   Depression Mother    Stroke Mother    Hypertension Mother    Depression Sister    Heart murmur Sister    Hypertension Father     Allergies  Allergen Reactions   Sulfa Antibiotics Other (See  Comments), Palpitations and Rash    Syncope Syncope Rapid heartrate Syncope Syncope   Codeine Rash   Sudafed [Pseudoephedrine] Rash    Current Outpatient Medications on File Prior to Visit  Medication Sig Dispense Refill   albuterol (VENTOLIN HFA) 108 (90 Base) MCG/ACT inhaler Inhale 2 puffs into the lungs every 6 (six) hours as needed for wheezing or shortness of breath. Can give proair. 18 g 0   ALPRAZolam (XANAX) 0.25 MG tablet 1 tab daily if needed 30 tablet 0   atenolol (TENORMIN) 25 MG tablet Take 1 tablet daily as needed for palpitations 30 tablet 2   diclofenac (VOLTAREN) 75 MG EC tablet Take 1 tablet (75 mg total) by mouth 2 (two) times daily. 20 tablet 0   ezetimibe (ZETIA) 10 MG tablet Take 1 tablet (10 mg total) by mouth daily. 30 tablet 3   famotidine (PEPCID) 20 MG tablet Take 1 tablet (20 mg total) by mouth 2 (two) times daily. 60 tablet 0   fluticasone (FLONASE) 50 MCG/ACT nasal spray 2 sprays by Each Nare route daily.     No current facility-administered medications on file prior to visit.    BP 129/89    Pulse 73    Resp 18    Ht 5\' 4"  (1.626 m)    Wt 139 lb 3.2 oz (63.1 kg)    LMP 05/08/2017 (Approximate)    SpO2 100%    BMI 23.89 kg/m       Objective:   Physical Exam  General- No acute distress. Pleasant patient. Neck- Full range of motion, no jvd Lungs- Clear, even and unlabored. Heart- regular rate and rhythm. Neurologic- CNII- XII grossly intact.  Bilateral lower extremity-both calves appear symmetric.  No obvious superficial veins that are visible.  No varicose veins.  Negative Homans' sign.  No redness or swelling to calfs.      Assessment & Plan:  Patient with bilateral mild calf soreness with elevated D-dimer.  [facility]  provider did ultrasounds and told her no DVTs on either extremity.  Her oxygen saturations are very good and no obvious shortness of breath.  Elevated D-dimer to be nonspecific.  However I do not want to rely solely on an  office ultrasound.  Not sure the credentials of the person who did that study.  Will confirm negative study with the radiology read.  I placed order tonight.  However when I called downstairs to schedule no answer.  So went downstairs and talk with radiology technicians.  Apparently there is no ultrasound technician working tonight and there is no Restaurant manager, fast food at [employer]  center either.  I do not think clinical presentation warrants emergency department evaluation tonight.  However did explain in detail to patient that if she has changing signs and symptoms such as calf swelling or shortness of breath then be seen in the emergency department tonight.  Patient expressed understanding and agree.  Will get staff to call down tomorrow and see if patient can get ultrasounds done either here or [place] [facility].  Follow-up date to be determined after ultrasound review.  Did advise patient going forward not to take any hormone supplementation.  Patient agrees with that advice.  Mackie Pai, PA-C   Time spent with patient today was 35  minutes which consisted of chart review, lab review from other office discussing diagnosis, work up, potential treatment and documentation.

## 2019-10-02 NOTE — ED Triage Notes (Signed)
Pt arrives POV for eval of chest pain x 1 day and leg pain x 5 days. Pt reports D-Dimer 5.64 at PCP, sent here for further eval.

## 2019-10-03 ENCOUNTER — Ambulatory Visit: Payer: No Typology Code available for payment source

## 2019-10-03 DIAGNOSIS — M79662 Pain in left lower leg: Secondary | ICD-10-CM

## 2019-10-03 DIAGNOSIS — R7989 Other specified abnormal findings of blood chemistry: Secondary | ICD-10-CM

## 2019-10-03 DIAGNOSIS — R0789 Other chest pain: Secondary | ICD-10-CM | POA: Diagnosis not present

## 2019-10-03 LAB — TROPONIN I (HIGH SENSITIVITY): Troponin I (High Sensitivity): <2 ng/L (ref <18)

## 2019-10-03 NOTE — ED Notes (Signed)
Discussed discharge instructions with patient, no further questions at this time. Patient ambulatory, A&O x 4 at discharge.

## 2019-10-03 NOTE — Discharge Instructions (Signed)
Thank you for allowing me to care for you today in the Emergency Department.   Please follow-up with your primary care clinician for recheck of your symptoms.  As we discussed, your work-up today was reassuring.  You did not have evidence of a blood clot.  It is also possible that your symptoms could be related to COVID-19.  We are still learning some of the long-term effects of the virus.  You can try a liquid magnesium supplement available over-the-counter that is designed specifically for leg cramps.  Use as directed on the label.  Return to the emergency department if you pass out, develop significant trouble breathing, if your leg gets red, hot to the touch, very swollen, if your toes turn blue, or if you develop other new, concerning symptoms.  Gracias por permitirme atenderlo [employer] de [facility].  Haga un seguimiento con su mdico de atencin primaria para volver a Chief Technology Officer sus sntomas. Como comentamos, su evaluacin de hoy fue reconfortante. No tena evidencia de un cogulo de sangre.  Tambin es posible que sus sntomas estn relacionados con COVID-19. Todava estamos aprendiendo [employer] a largo plazo del virus.  Puede probar un suplemento de magnesio lquido disponible sin receta que est diseado especficamente para los calambres en las piernas. Use como se indica en la etiqueta.  Regrese a la sala de emergencias si se desmaya, presenta problemas significativos para respirar, si su pierna se enrojece, se siente caliente al tacto, muy inflamada, si los dedos de los pies se ponen azules o si presenta otros sntomas nuevos y preocupantes.

## 2019-10-03 NOTE — ED Provider Notes (Signed)
[facility] EMERGENCY DEPARTMENT Provider Note   CSN: [id number] Arrival date & time: 10/02/19  1928     History Chief Complaint  Patient presents with  . Chest Pain  . Leg Injury    Christine Rosales is a 44 y.o. female with a history of asthma, anxiety, fibromyalgia who presents to the emergency department from her PCP's office with a chief complaint of elevated D-dimer.  The patient reports that she has been having dull pain in her bilateral lower ribs and bilateral upper chest for the last few days.  Pain has been constant.  Pain seems improved with exertion.  Pain is not pleuritic and is not associated with eating.  No other known aggravating or alleviating factors.  She denies shortness of breath, leg swelling, palpitations, PND, orthopnea, cough, back pain, nausea, vomiting, diarrhea, dysuria, hematuria, flank pain, diaphoresis, fever, or chills.  She also reports that she has been having pins-and-needles sensation in the sole of her bilateral feet for the last few weeks.  She also reports that she is having cramping pain in her bilateral calves pain is worse when she walks and when she elevates her legs and worse at night and when her legs are dependent.  States that the pain feels similar to cramping associate with menstrual cycle.   She was diagnosed with COVID-19 in November 2020.   The history is provided by the patient and medical records. A language interpreter was used ([place]).       Past Medical History:  Diagnosis Date  . Anxiety   . Asthma   . Fibromyalgia   . Headache   . Palpitations   . TMJ (dislocation of temporomandibular joint)     Patient Active Problem List   Diagnosis Date Noted  . GAD (generalized anxiety disorder) 04/07/2015  . Panic disorder without agoraphobia 04/07/2015  . Anxiety state 02/27/2015  . Palpitations 02/27/2015  . Fibromyalgia 02/27/2015  . 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 NO PAST SURGERIES       OB History   No obstetric history on file.     Family History  Problem Relation Age of Onset  . Depression Mother   . Stroke Mother   . Hypertension Mother   . Depression Sister   . Heart murmur Sister   . Hypertension Father     Social History   Tobacco Use  . Smoking status: Never Smoker  . Smokeless tobacco: Never Used  Vaping Use  . Vaping Use: Never used  Substance Use Topics  . Alcohol use: No  . Drug use: No    Home Medications Prior to Admission medications   Medication Sig Start Date End Date Taking? Authorizing Provider  albuterol (VENTOLIN HFA) 108 (90 Base) MCG/ACT inhaler Inhale 2 puffs into the lungs every 6 (six) hours as needed for wheezing or shortness of breath. Can give proair. 08/30/19   Saguier, Percell Miller, PA-C  ALPRAZolam Duanne Moron) 0.25 MG tablet 1 tab daily if needed 01/09/19   Saguier, Percell Miller, PA-C  atenolol (TENORMIN) 25 MG tablet Take 1 tablet daily as needed for palpitations 08/30/19   Saguier, Percell Miller, PA-C  diclofenac (VOLTAREN) 75 MG EC tablet Take 1 tablet (75 mg total) by mouth 2 (two) times daily. 08/30/19   Saguier, Percell Miller, PA-C  ezetimibe (ZETIA) 10 MG tablet Take 1 tablet (10 mg total) by mouth daily. 07/16/18   Saguier, Percell Miller, PA-C  famotidine (PEPCID) 20 MG tablet  Take 1 tablet (20 mg total) by mouth 2 (two) times daily. 08/30/19   Saguier, Percell Miller, PA-C  fluticasone (FLONASE) 50 MCG/ACT nasal spray 2 sprays by Each Nare route daily. 04/22/19   [provider]    Allergies    Sulfa antibiotics, Codeine, and Sudafed [pseudoephedrine]  Review of Systems   Review of Systems  Constitutional: Negative for activity change, chills, diaphoresis, fatigue and fever.  HENT: Negative for congestion and sore throat.   Respiratory: Negative for cough, chest tightness, shortness of breath and wheezing.   Cardiovascular: Positive for chest pain. Negative for palpitations and leg swelling.    Gastrointestinal: Negative for abdominal pain, diarrhea, nausea and vomiting.  Genitourinary: Negative for decreased urine volume, dysuria, vaginal bleeding and vaginal pain.  Musculoskeletal: Positive for myalgias. Negative for arthralgias, back pain, gait problem, neck pain and neck stiffness.  Skin: Negative for color change, rash and wound.  Allergic/Immunologic: Negative for immunocompromised state.  Neurological: Negative for dizziness, seizures, syncope, weakness, numbness and headaches.       Paresthesias  Psychiatric/Behavioral: Negative for confusion.    Physical Exam Updated Vital Signs BP (!) 139/91 (BP Location: Right Arm)   Pulse 79   Temp 98 F (36.7 C) (Oral)   Resp 17   Ht 5\' 4"  (1.626 m)   Wt 63 kg   LMP 05/08/2017 (Approximate)   SpO2 100%   BMI 23.86 kg/m   Physical Exam Vitals and nursing note reviewed.  Constitutional:      General: She is not in acute distress.    Appearance: She is not ill-appearing, toxic-appearing or diaphoretic.  HENT:     Head: Normocephalic.  Eyes:     Conjunctiva/sclera: Conjunctivae normal.  Cardiovascular:     Rate and Rhythm: Normal rate and regular rhythm.     Heart sounds: No murmur heard.  No friction rub. No gallop.   Pulmonary:     Effort: Pulmonary effort is normal. No respiratory distress.     Breath sounds: No stridor. No wheezing, rhonchi or rales.     Comments: Lungs are clear to auscultation bilaterally.  No increased work of breathing.  No reproducible tenderness palpation to the chest wall.  No rashes.  No wounds. Chest:     Chest wall: No tenderness.  Abdominal:     General: There is no distension.     Palpations: Abdomen is soft. There is no mass.     Tenderness: There is no abdominal tenderness. There is no right CVA tenderness, left CVA tenderness, guarding or rebound.     Hernia: No hernia is present.     Comments: Abdomen soft, nontender, nondistended.  Musculoskeletal:     Cervical back: Neck  supple.     Comments: 5-5 strength against resistance of the bilateral lower extremities.  Sensation is intact and equal throughout.  No reproducible tenderness palpation to the soles of the bilateral feet.  Calves are not edematous, erythematous, warm.  No reproducible tenderness palpation to bilateral calves.  Normal exam of the bilateral ankles and knees.  Skin:    General: Skin is warm.     Capillary Refill: Capillary refill takes less than 2 seconds.     Findings: No rash.  Neurological:     Mental Status: She is alert.  Psychiatric:        Behavior: Behavior normal.     ED Results / Procedures / Treatments   Labs (all labs ordered are listed, but only abnormal results are displayed) Labs Reviewed  BASIC METABOLIC PANEL - Abnormal; Notable for the following components:      Result Value   Glucose, Bld 105 (*)    All other components within normal limits  I-STAT CHEM 8, ED - Abnormal; Notable for the following components:   Glucose, Bld 100 (*)    All other components within normal limits  CBC  I-STAT BETA HCG BLOOD, ED ([facility], [facility], [facility] ONLY)  TROPONIN I (HIGH SENSITIVITY)  TROPONIN I (HIGH SENSITIVITY)    EKG EKG Interpretation  Date/Time:  Thursday October 03 2019 05:13:01 EDT Ventricular Rate:  71 PR Interval:    QRS Duration: 100 QT Interval:  400 QTC Calculation: 435 R Axis:   51 Text Interpretation: Sinus rhythm rate is slower on this EKG Confirmed by Ripley Fraise [id number] on 10/03/2019 5:15:19 AM   Radiology DG Chest 2 View  Result Date: 10/02/2019 CLINICAL DATA:  Chest pain EXAM: CHEST - 2 VIEW COMPARISON:  02/08/2019 FINDINGS: The heart size and mediastinal contours are within normal limits. Both lungs are clear. The visualized skeletal structures are unremarkable. IMPRESSION: No active cardiopulmonary disease. Electronically Signed   By: Fidela Salisbury MD   On: 10/02/2019 20:38   CT Angio Chest PE W and/or Wo Contrast  Result Date: 10/02/2019 CLINICAL DATA:   Bilateral mid calf tenderness, elevated D-dimer, chest pain EXAM: CT ANGIOGRAPHY CHEST WITH CONTRAST TECHNIQUE: Multidetector CT imaging of the chest was performed using the standard protocol during bolus administration of intravenous contrast. Multiplanar CT image reconstructions and MIPs were obtained to evaluate the vascular anatomy. CONTRAST:  29mL OMNIPAQUE IOHEXOL 350 MG/ML SOLN COMPARISON:  10/25/2016, 10/02/2019 FINDINGS: Cardiovascular: This is a technically adequate evaluation of the pulmonary vasculature. No filling defects or pulmonary emboli. The heart is unremarkable without pericardial effusion. Normal caliber of the thoracic aorta. Mediastinum/Nodes: No enlarged mediastinal, hilar, or axillary lymph nodes. Thyroid gland, trachea, and esophagus demonstrate no significant findings. Lungs/Pleura: No airspace disease, effusion, or pneumothorax. The central airways are patent. Upper Abdomen: No acute abnormality. Musculoskeletal: No acute displaced fractures. Reconstructed images demonstrate no additional findings. Review of the MIP images confirms the above findings. IMPRESSION: 1. No evidence of pulmonary embolus. 2. No acute intrathoracic process. Electronically Signed   By: Randa Ngo M.D.   On: 10/02/2019 22:08    Procedures Procedures (including critical care time)  Medications Ordered in ED Medications  iohexol (OMNIPAQUE) 350 MG/ML injection 55 mL (55 mLs Intravenous Contrast Given 10/02/19 2201)    ED Course  I have reviewed the triage vital signs and the nursing notes.  Pertinent labs & imaging results that were available during my care of the patient were reviewed by me and considered in my medical decision making (see chart for details).    MDM Rules/Calculators/A&P                          44 year old female with a history of asthma, anxiety, fibromyalgia sent from her PCPs office for an elevated D-dimer.  Patient has been having dull chest and upper abdominal pain for  several days, but no shortness of breath.  Chest pain is not pleuritic.  She also reports that she has been having cramping pain in her calves and paresthesias in her bilateral feet that have been ongoing for almost a month.  No electrolyte derangements.  She is having no other muscle cramps.  Muscle cramps are also only present at night.  Denies any dark urine.  I have a  low suspicion for rhabdomyolysis.  She has no electrolyte derangements.  No peripheral edema, and I doubt acute congestive heart failure.  Calves are also nontender to palpation.   EKG with normal sinus rhythm.  Delta troponin is not elevated. HEAR score is 1.  Patient's symptoms are very atypical for ACS.  CT PE study was performed and is negative for PE.  No other acute findings on imaging.  Work-up was otherwise reassuring.  Doubt aortic dissection, tension pneumothorax, pericarditis, myocarditis.  Patient was diagnosed with Covid and November 2020.  Symptoms could be secondary to long Covid, but overall she is hemodynamically stable and in no acute distress.  No indication for admission at this time.  All questions answered and information communicated with the patient via a Spanish medical interpreter.  Safe for discharge to home with outpatient follow-up.  Final Clinical Impression(s) / ED Diagnoses Final diagnoses:  Bilateral leg and foot pain  Atypical chest pain    Rx / DC Orders ED Discharge Orders    None       Joanne Gavel, PA-C 10/03/19 0749    Ripley Fraise, MD 10/04/19 [phone]

## 2020-05-25 ENCOUNTER — Telehealth: Payer: Self-pay | Admitting: Cardiology

## 2020-05-25 NOTE — Telephone Encounter (Signed)
Called pt LVM to call us back to update address as patient has a returned mail  mail status.

## 2022-09-14 NOTE — Telephone Encounter (Signed)
Pcp removed
